# Patient Record
Sex: Male | Born: 1950 | Race: White | Hispanic: No | Marital: Married | Smoking: Never smoker
Health system: Southern US, Community
[De-identification: ages and names within clinical notes are randomized; demographics above are authoritative.]

## PROBLEM LIST (undated history)

## (undated) DIAGNOSIS — K219 Gastro-esophageal reflux disease without esophagitis: Secondary | ICD-10-CM

## (undated) DIAGNOSIS — E119 Type 2 diabetes mellitus without complications: Secondary | ICD-10-CM

## (undated) DIAGNOSIS — I10 Essential (primary) hypertension: Secondary | ICD-10-CM

## (undated) DIAGNOSIS — J45909 Unspecified asthma, uncomplicated: Secondary | ICD-10-CM

## (undated) DIAGNOSIS — G2581 Restless legs syndrome: Secondary | ICD-10-CM

## (undated) DIAGNOSIS — E039 Hypothyroidism, unspecified: Secondary | ICD-10-CM

## (undated) DIAGNOSIS — N200 Calculus of kidney: Secondary | ICD-10-CM

## (undated) DIAGNOSIS — M109 Gout, unspecified: Secondary | ICD-10-CM

## (undated) DIAGNOSIS — E7800 Pure hypercholesterolemia, unspecified: Secondary | ICD-10-CM

## (undated) HISTORY — DX: Pure hypercholesterolemia, unspecified: E78.00

## (undated) HISTORY — DX: Hypothyroidism, unspecified: E03.9

## (undated) HISTORY — DX: Type 2 diabetes mellitus without complications: E11.9

## (undated) HISTORY — DX: Calculus of kidney: N20.0

## (undated) HISTORY — DX: Essential (primary) hypertension: I10

## (undated) HISTORY — DX: Gout, unspecified: M10.9

## (undated) HISTORY — DX: Restless legs syndrome: G25.81

## (undated) HISTORY — PX: OTHER SURGICAL HISTORY: SHX169

---

## 2006-01-03 ENCOUNTER — Ambulatory Visit: Payer: Self-pay | Admitting: Family Medicine

## 2006-02-01 ENCOUNTER — Ambulatory Visit: Payer: Self-pay | Admitting: Family Medicine

## 2006-05-01 ENCOUNTER — Ambulatory Visit: Payer: Self-pay | Admitting: Family Medicine

## 2006-10-06 ENCOUNTER — Ambulatory Visit: Payer: Self-pay | Admitting: Physician Assistant

## 2007-02-08 ENCOUNTER — Inpatient Hospital Stay (HOSPITAL_COMMUNITY): Admission: EM | Admit: 2007-02-08 | Discharge: 2007-02-10 | Payer: Self-pay | Admitting: Emergency Medicine

## 2008-04-22 IMAGING — CT CT ABDOMEN W/ CM
2 of 5 series · 17 of 46 positions shown, 19 images · IV contrast (omnipaque)
Comparison: None.

CLINICAL DATA: GI bleed. Abdominal pain.
ABDOMEN CT WITH CONTRAST:
TECHNIQUE: Multidetector CT imaging of the abdomen was performed following the standard protocol during bolus administration of intravenous contrast.
Contrast:  125 cc Omnipaque 300
TECHNIQUE: Multidetector CT imaging of the pelvis was performed following the standard protocol during bolus administration of intravenous contrast.

[Series 2: abd_pel 5.0 b40s · axial · 0.80mm/px · z∈[-442,+18]mm · 14 of 104 slices shown, 16 images]
[im 6/104  soft-tissue]
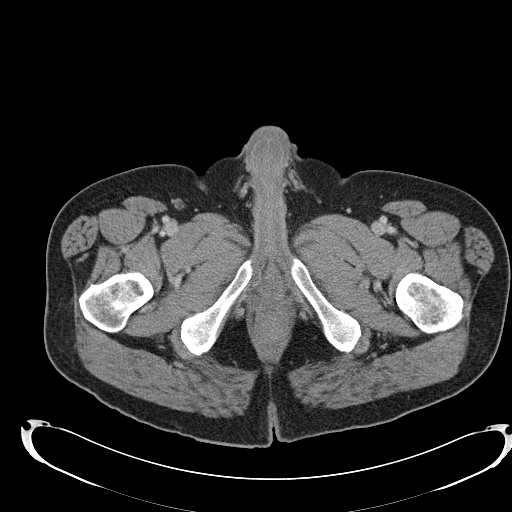
[im 6/104  bone]
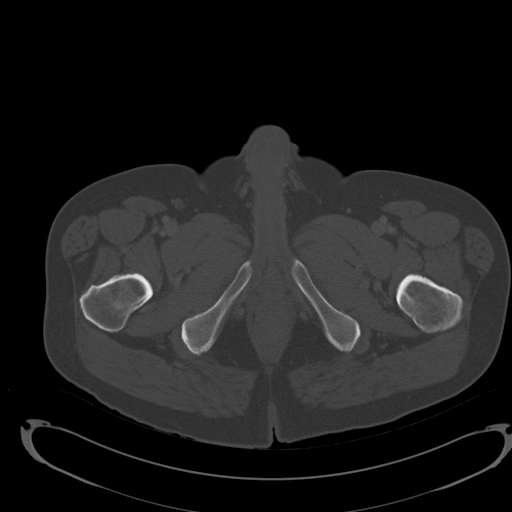
[im 16/104  soft-tissue]
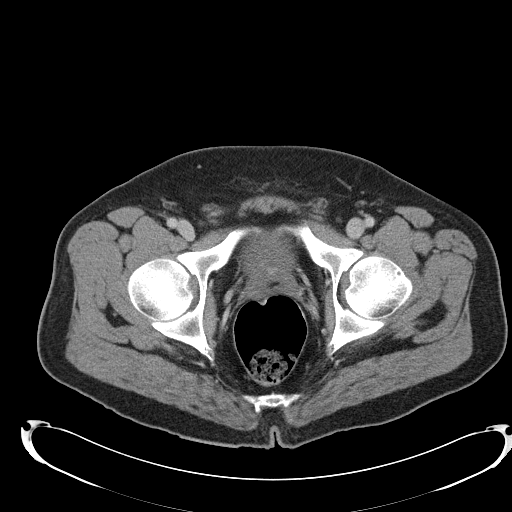
[im 21/104  soft-tissue]
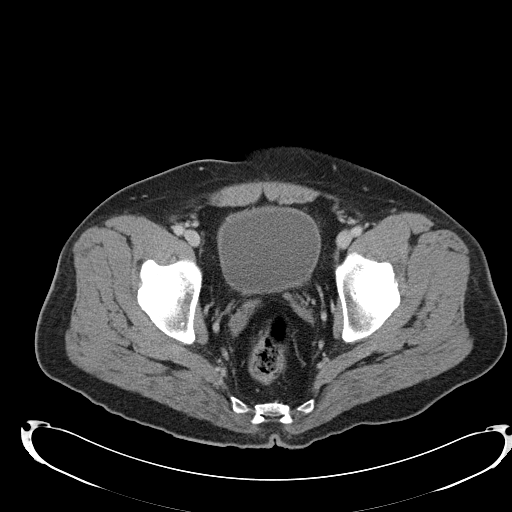
[im 26/104  soft-tissue]
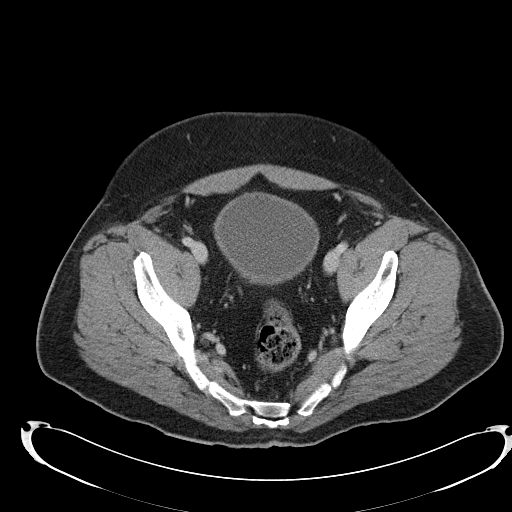
[im 37/104  soft-tissue]
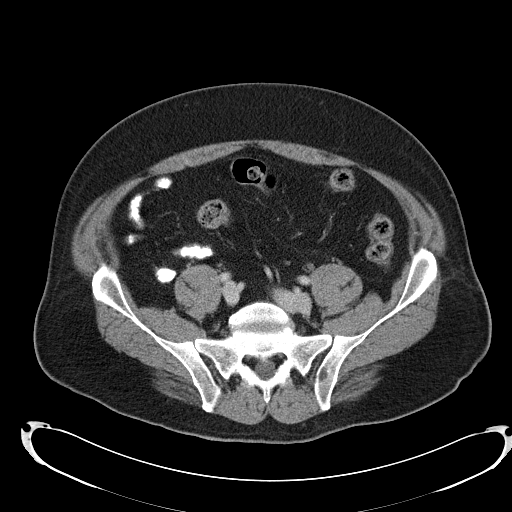
[im 42/104  soft-tissue]
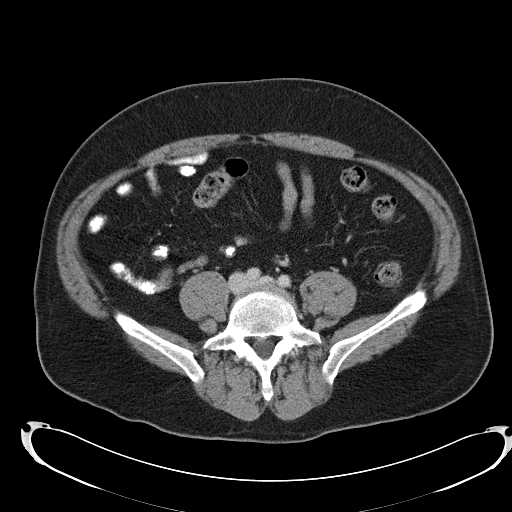
[im 47/104  soft-tissue]
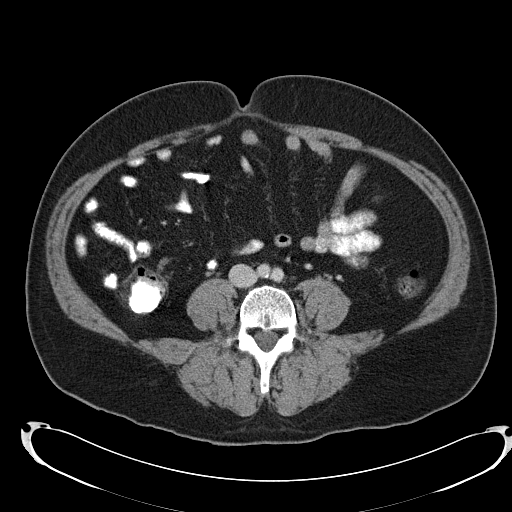
[im 57/104  soft-tissue]
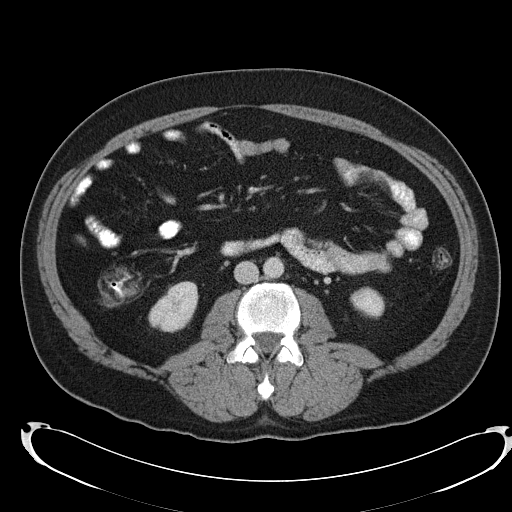
[im 62/104  soft-tissue]
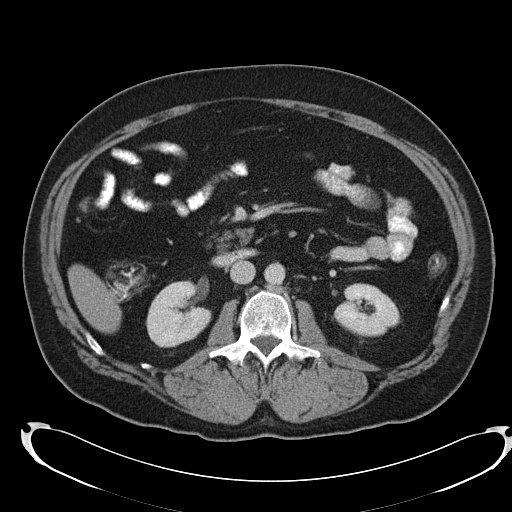
[im 62/104  bone]
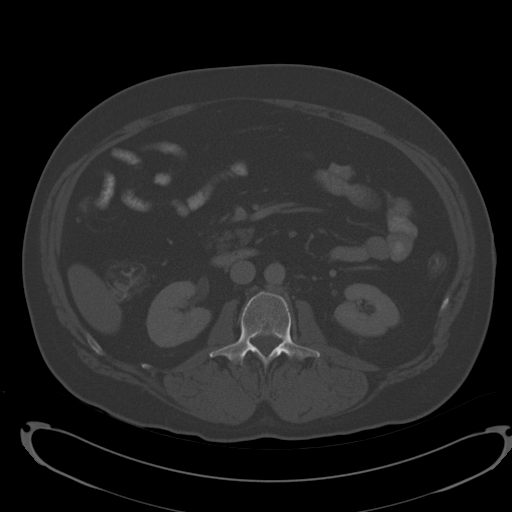
[im 67/104  soft-tissue]
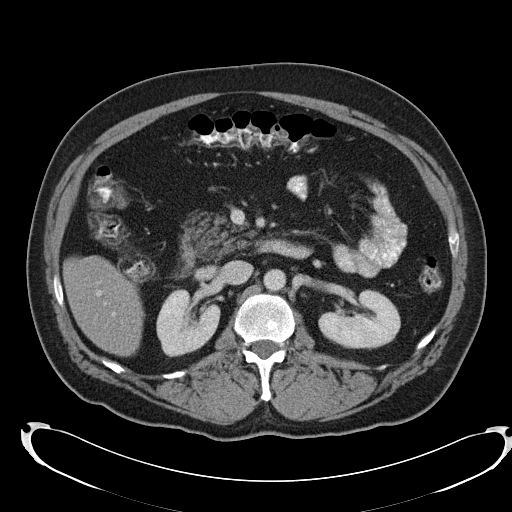
[im 78/104  soft-tissue]
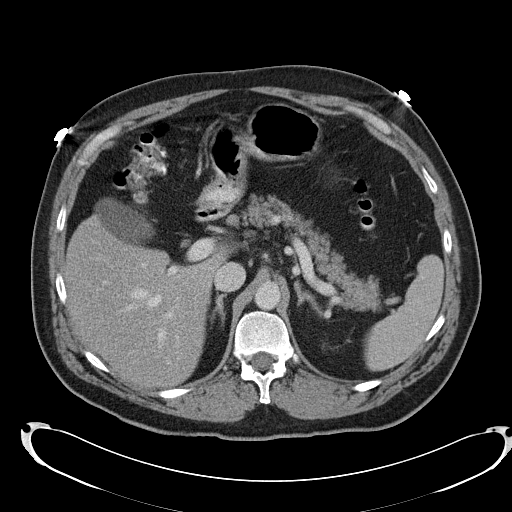
[im 83/104  soft-tissue]
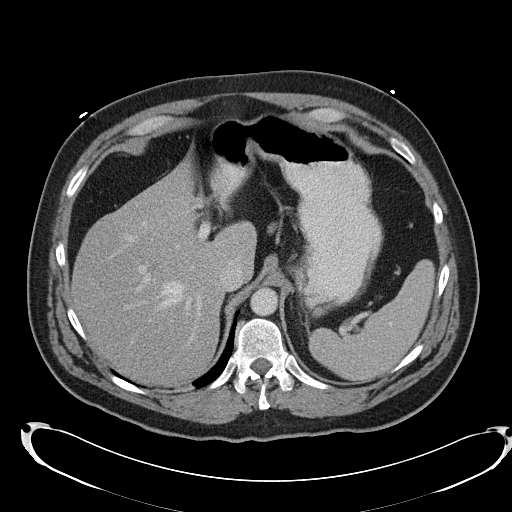
[im 88/104  soft-tissue]
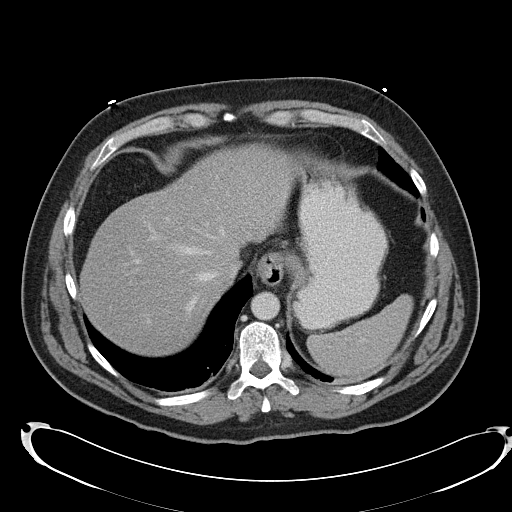
[im 98/104  soft-tissue]
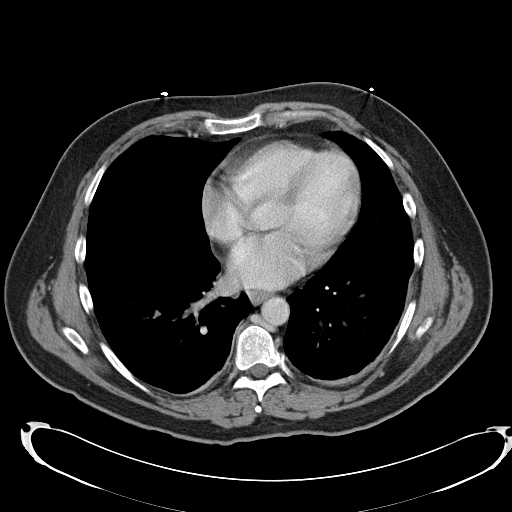

[Series 602: <mpr thick range> · coronal · 1.01mm/px · 3 of 84 slices shown]
[im 28/84  soft-tissue]
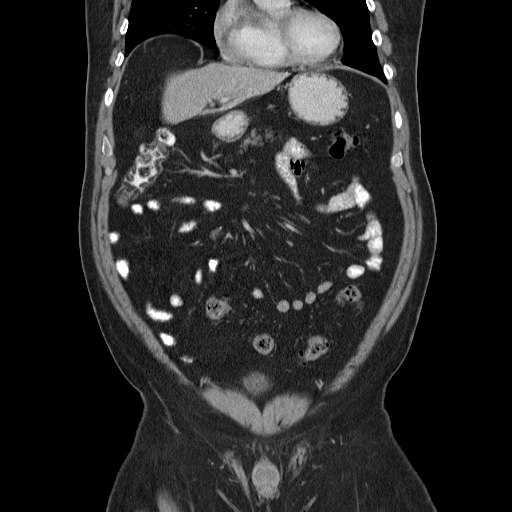
[im 37/84  soft-tissue]
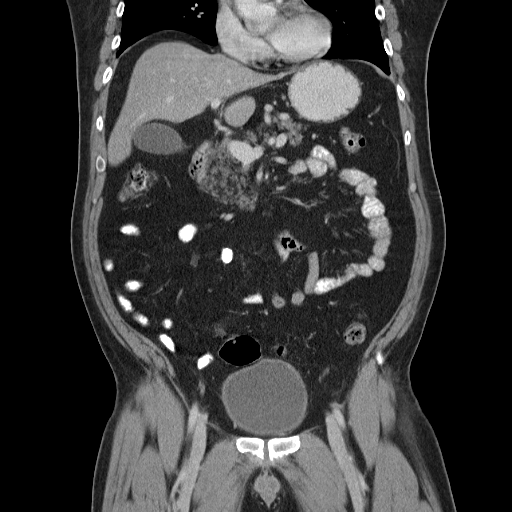
[im 47/84  soft-tissue]
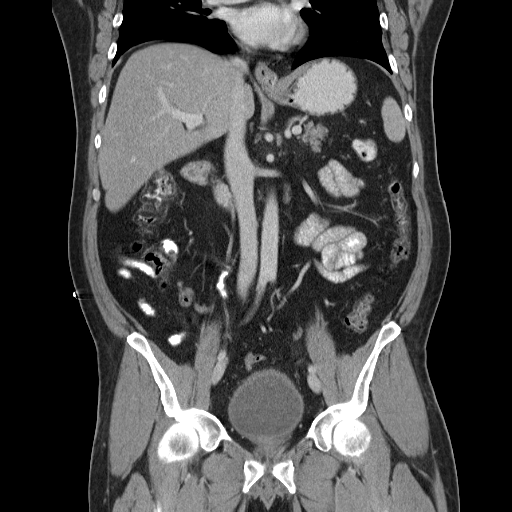

[17 of 46 positions shown; findings below may reference images not displayed]

FINDINGS: There are mild dependent type changes identified in both lung bases.
The liver is normal.  
Spleen is normal. Pancreas is normal.
Both adrenal glands are normal.
Left kidney is normal.  Within the lower pole of the right kidney, there is a stone which measures 9.4 mm (image 45).  Negative for retroperitoneal or small bowel mesenteric adenopathy.
Negative for free fluid. Negative for free intraperitoneal air.
The large bowel is abnormal in appearance. There is mild diffuse edema of the bowel wall in the setting of lower gastrointestinal bleed. Findings are concerning for colitis.  No masses are identified.
Review of the bone windows shows no lytic or sclerotic lesions.
IMPRESSION: 1.  Mild diffuse wall thickening involving the colon.  Findings may represent colitis. 
2.  No masses are identified.
PELVIS CT WITH CONTRAST:
FINDINGS: The sigmoid colon and rectum are normal.  Negative for free fluid.  Urinary bladder is negative.  
  Negative for pelvic or inguinal adenopathy.
Review of the bone windows is unremarkable.
IMPRESSION: No acute pelvic CT findings.

## 2016-08-17 DIAGNOSIS — M109 Gout, unspecified: Secondary | ICD-10-CM | POA: Insufficient documentation

## 2016-08-17 DIAGNOSIS — E039 Hypothyroidism, unspecified: Secondary | ICD-10-CM | POA: Insufficient documentation

## 2016-08-17 DIAGNOSIS — E1169 Type 2 diabetes mellitus with other specified complication: Secondary | ICD-10-CM | POA: Insufficient documentation

## 2016-10-02 ENCOUNTER — Ambulatory Visit: Payer: BLUE CROSS/BLUE SHIELD | Attending: Sports Medicine | Admitting: Physical Therapy

## 2016-10-02 ENCOUNTER — Encounter: Payer: Self-pay | Admitting: Physical Therapy

## 2016-10-02 DIAGNOSIS — M25611 Stiffness of right shoulder, not elsewhere classified: Secondary | ICD-10-CM | POA: Diagnosis present

## 2016-10-02 DIAGNOSIS — M25511 Pain in right shoulder: Secondary | ICD-10-CM | POA: Diagnosis not present

## 2016-10-02 NOTE — Therapy (Signed)
[facility] [address] [place], [place], [address] Phone: [phone]   Fax:  [phone]  Physical Therapy Evaluation  Patient Details  Name: Warren DenisJames H Johnstone MRN: [id number] Date of Birth: 1951/08/07 Referring Provider: Rodolph BongAdam Kendall MD.  Encounter Date: 10/02/2016      PT End of Session - 10/02/16 1554    Visit Number 1   Number of Visits 12   Date for PT Re-Evaluation 11/13/16   PT Start Time 0315   PT Stop Time 0352   PT Time Calculation (min) 37 min   Activity Tolerance Patient tolerated treatment well   Behavior During Therapy [facility] for tasks assessed/performed      Past Medical History:  Diagnosis Date  . Diabetes mellitus without complication (HCC)   . Gout   . Hypertension     No past surgical history on file.  There were no vitals filed for this visit.       Subjective Assessment - 10/02/16 1558    Subjective The patient reports that the first of last summer he began to experience right shoulder discomfort.  As time went on his shoulder began to hurt more and he lost range of motion.  He eventually saw a Dr. and receibved a shot which did not help much.  However, he received anothet injection last wekk "in the joint" and it helped a lot.  In fact due to the injection his pain-level is reduced to a resting level of a 2/10 today and his motion did improve as well.  Ice also helps reduce his pain.            [facility] - 10/02/16 0001      Assessment   Medical Diagnosis Adhesive capsulitis.   Referring Provider Rodolph BongAdam Kendall MD.   Onset Date/Surgical Date --  First of last summer   Next MD Visit --  11/08/16.     Precautions   Precautions None     Restrictions   Weight Bearing Restrictions No     Balance Screen   Has the patient fallen in the past 6 months No   Has the patient had a decrease in activity level because of a fear of falling?  No   Is the patient reluctant to leave their home  because of a fear of falling?  No     Home Environment   Living Environment Private residence     Prior Function   Level of Independence Independent     Posture/Postural Control   Posture/Postural Control Postural limitations   Postural Limitations Rounded Shoulders;Forward head;Decreased thoracic kyphosis     ROM / Strength   AROM / PROM / Strength AROM;Strength     AROM   Overall AROM Comments Active right shoulder flexion in the antigravity position= 72 degrees; ER= 18 degrees and behind back to right SIJ region.     Strength   Overall Strength Comments Right shoulder tested into abduction, IR/ER= 4- to 4/5.     Palpation   Palpation comment Soem right ACJ pain but his CC is pain referred into right middle deltoid region.     Special Tests    Special Tests Rotator Cuff Impingement  (+) Impingement test of right shoulder. 1+/4+ DTR's RR UE.     Ambulation/Gait   Gait Comments WNL.                   [facility] Adult PT Treatment/Exercise - 10/02/16 0001  Exercises   Exercises Shoulder     Shoulder Exercises: Supine   Other Supine Exercises Supine cane exercise to passively increase right shoulder ER and into flexion.     Shoulder Exercises: Pulleys   Flexion Limitations --  5 minutes.     Shoulder Exercises: ROM/Strengthening   UBE (Upper Arm Bike) 6 minutes at 120 RPM's.                PT Education - 10/02/16 1616    Education provided Yes   Person(s) Educated Patient   Methods Explanation;Demonstration;Tactile cues;Verbal cues   Comprehension Verbalized understanding;Returned demonstration             PT Long Term Goals - 10/02/16 1618      PT LONG TERM GOAL #1   Title Independent with a HEP.   Time 4   Period Weeks   Status New     PT LONG TERM GOAL #2   Title Active right shoulder flexion to 145 degrees so the patient can easily reach overhead   Time 4   Period Weeks   Status New     PT LONG TERM GOAL #3   Title  Active ER to 70 degrees+ to allow for easily donning/doffing of apparel   Time 4   Period Weeks   Status New     PT LONG TERM GOAL #4   Title Increase ROM so patient is able to reach behind back to L3.   Time 4   Period Weeks   Status New     PT LONG TERM GOAL #5   Title Patient perform ADL's with pain not > 3/10.   Time 4   Period Weeks   Status New               Plan - 10/02/16 1608    Rehab Potential Good   PT Frequency 3x / week   PT Duration 4 weeks   PT Treatment/Interventions ADLs/Self Care Home Management;Cryotherapy;Electrical Stimulation;Ultrasound;Moist Heat;Therapeutic activities;Therapeutic exercise;Patient/family education;Passive range of motion;Manual techniques   PT Next Visit Plan PROM and capsular stretching of right shoulder.  Please give pics of home exercises.  UE Ranger; cane exercises; wall ladder.  Modalites PRN.      Patient will benefit from skilled therapeutic intervention in order to improve the following deficits and impairments:  Decreased activity tolerance, Pain, Decreased strength, Decreased range of motion  Visit Diagnosis: Acute pain of right shoulder - Plan: PT plan of care cert/re-cert  Stiffness of right shoulder, not elsewhere classified - Plan: PT plan of care cert/re-cert     Problem List There are no active problems to display for this patient.   Lovina Zuver, [place] MPT 10/02/2016, 4:21 PM  [facility] [address] [place], [place], [address] Phone: [phone]   Fax:  [phone]  Name: HASKEL DEWALT MRN: [id number] Date of Birth: 1951-06-22

## 2016-10-02 NOTE — Patient Instructions (Signed)
Supine cane exercise into right shoulder ER and flexion.

## 2016-10-03 ENCOUNTER — Ambulatory Visit: Payer: BLUE CROSS/BLUE SHIELD | Admitting: Physical Therapy

## 2016-10-03 DIAGNOSIS — M25511 Pain in right shoulder: Secondary | ICD-10-CM | POA: Diagnosis not present

## 2016-10-03 DIAGNOSIS — M25611 Stiffness of right shoulder, not elsewhere classified: Secondary | ICD-10-CM

## 2016-10-03 NOTE — Therapy (Signed)
[facility] [address] [place], [place], [address] Phone: [phone]   Fax:  [phone]  Physical Therapy Treatment  Patient Details  Name: Warren Newton MRN: [id number] Date of Birth: 03/25/1951 Referring Provider: Rodolph BongAdam Kendall MD.  Encounter Date: 10/03/2016      PT End of Session - 10/03/16 1707    Visit Number 2   Number of Visits 12   Date for PT Re-Evaluation 11/13/16   PT Start Time 0400   PT Stop Time 0455   PT Time Calculation (min) 55 min   Activity Tolerance Patient tolerated treatment well   Behavior During Therapy [facility] for tasks assessed/performed      Past Medical History:  Diagnosis Date  . Diabetes mellitus without complication (HCC)   . Gout   . Hypertension     No past surgical history on file.  There were no vitals filed for this visit.      Subjective Assessment - 10/03/16 1710    Subjective I made me some pulleys and put them over a door at home.   Patient Stated Goals Move my right shoulder without pain.   Pain Score 2    Pain Location Shoulder   Pain Orientation Right   Pain Descriptors / Indicators Aching   Pain Type Acute pain   Pain Onset More than a month ago                         [facility] PT Treatment/Exercise - 10/03/16 0001      Shoulder Exercises: Standing   Other Standing Exercises Doorway stretch to increase flexion and ER (4 minutes).     Shoulder Exercises: Pulleys   Flexion Limitations 5 minutes.   Other Pulley Exercises UE Ranger x 4 minutes     Shoulder Exercises: ROM/Strengthening   UBE (Upper Arm Bike) 8 minutes.     Modalities   Modalities Ultrasound     Ultrasound   Ultrasound Location In supine right shoulder   Ultrasound Parameters 1.50 W/CM2 x 10 minutes while patient received a sustained low load long duration stretch into ER     Manual Therapy   Manual therapy comments 4 minutes into right shoulder flexion passively.                 PT Education - 10/02/16 1616    Education provided Yes   Person(s) Educated Patient   Methods Explanation;Demonstration;Tactile cues;Verbal cues   Comprehension Verbalized understanding;Returned demonstration             PT Long Term Goals - 10/02/16 1618      PT LONG TERM GOAL #1   Title Independent with a HEP.   Time 4   Period Weeks   Status New     PT LONG TERM GOAL #2   Title Active right shoulder flexion to 145 degrees so the patient can easily reach overhead   Time 4   Period Weeks   Status New     PT LONG TERM GOAL #3   Title Active ER to 70 degrees+ to allow for easily donning/doffing of apparel   Time 4   Period Weeks   Status New     PT LONG TERM GOAL #4   Title Increase ROM so patient is able to reach behind back to L3.   Time 4   Period Weeks   Status New     PT LONG TERM GOAL #5  Title Patient perform ADL's with pain not > 3/10.   Time 4   Period Weeks   Status New               Plan - 10/02/16 1608    Rehab Potential Good   PT Frequency 3x / week   PT Duration 4 weeks   PT Treatment/Interventions ADLs/Self Care Home Management;Cryotherapy;Electrical Stimulation;Ultrasound;Moist Heat;Therapeutic activities;Therapeutic exercise;Patient/family education;Passive range of motion;Manual techniques   PT Next Visit Plan PROM and capsular stretching of right shoulder.  Please give pics of home exercises.  UE Ranger; cane exercises; wall ladder.  Modalites PRN.      Patient will benefit from skilled therapeutic intervention in order to improve the following deficits and impairments:  Decreased activity tolerance, Pain, Decreased strength, Decreased range of motion  Visit Diagnosis: Acute pain of right shoulder  Stiffness of right shoulder, not elsewhere classified     Problem List There are no active problems to display for this patient.   Josehua Hammar, [place] MPT 10/03/2016, 5:22 PM  [facility] [address] [place], [place], [address] Phone: [phone]   Fax:  [phone]  Name: Warren Newton MRN: [id number] Date of Birth: 09-13-1951

## 2016-10-10 ENCOUNTER — Ambulatory Visit: Payer: BLUE CROSS/BLUE SHIELD | Admitting: Physical Therapy

## 2016-10-10 DIAGNOSIS — M25511 Pain in right shoulder: Secondary | ICD-10-CM

## 2016-10-10 DIAGNOSIS — M25611 Stiffness of right shoulder, not elsewhere classified: Secondary | ICD-10-CM

## 2016-10-10 NOTE — Therapy (Signed)
[facility] [address] [place], [place], [address] Phone: [phone]   Fax:  [phone]  Physical Therapy Treatment  Patient Details  Name: Warren DenisJames H Cullin MRN: [id number] Date of Birth: 09/26/1951 Referring Provider: Rodolph BongAdam Kendall MD.  Encounter Date: 10/10/2016      PT End of Session - 10/10/16 1523    Visit Number 3   Number of Visits 12   Date for PT Re-Evaluation 11/13/16   PT Start Time 1521   PT Stop Time 1619   PT Time Calculation (min) 58 min   Activity Tolerance Patient tolerated treatment well   Behavior During Therapy [facility] for tasks assessed/performed      Past Medical History:  Diagnosis Date  . Diabetes mellitus without complication (HCC)   . Gout   . Hypertension     No past surgical history on file.  There were no vitals filed for this visit.      Subjective Assessment - 10/10/16 1522    Subjective Reports that he has seen some improvement in regards to ROM as he noticed he can move shoulder in ways he wasn't able to before.   Pertinent History DM.   Patient Stated Goals Move my right shoulder without pain.   Currently in Pain? Other (Comment)  No reports of pain by patient            [facility] - 10/10/16 0001      Assessment   Medical Diagnosis Adhesive capsulitis.   Next MD Visit 11/08/2016     Precautions   Precautions None     Restrictions   Weight Bearing Restrictions No                     [facility] Adult PT Treatment/Exercise - 10/10/16 0001      Shoulder Exercises: Pulleys   Flexion Other (comment)  x5 min   Other Pulley Exercises UE ranger into flexion/ CW and CCW circles x20 reps each     Shoulder Exercises: ROM/Strengthening   UBE (Upper Arm Bike) 120 RPM x6 min     Shoulder Exercises: Stretch   External Rotation Stretch 5 reps;10 seconds   Other Shoulder Stretches RUE towel stretch x5 reps 10 sec hold     Modalities   Modalities Electrical  Stimulation;Moist Heat     Moist Heat Therapy   Number Minutes Moist Heat 15 Minutes   Moist Heat Location Shoulder     Electrical Stimulation   Electrical Stimulation Location R shoulder   Electrical Stimulation Action Pre-Mod   Electrical Stimulation Parameters 80-150 hz x15 min   Electrical Stimulation Goals Pain     Manual Therapy   Manual Therapy Joint mobilization;Passive ROM   Joint Mobilization G I-II R glenohumeral joint mobilizations into A/P/I to promote decreaesed pain and increase ROM   Passive ROM PROM of R shoulder into flex/ER with holds at end range to promote increased ROM                     PT Long Term Goals - 10/02/16 1618      PT LONG TERM GOAL #1   Title Independent with a HEP.   Time 4   Period Weeks   Status New     PT LONG TERM GOAL #2   Title Active right shoulder flexion to 145 degrees so the patient can easily reach overhead   Time 4   Period Weeks   Status New  PT LONG TERM GOAL #3   Title Active ER to 70 degrees+ to allow for easily donning/doffing of apparel   Time 4   Period Weeks   Status New     PT LONG TERM GOAL #4   Title Increase ROM so patient is able to reach behind back to L3.   Time 4   Period Weeks   Status New     PT LONG TERM GOAL #5   Title Patient perform ADL's with pain not > 3/10.   Time 4   Period Weeks   Status New               Plan - 10/10/16 1611    Clinical Impression Statement Patient presented in clinic with reported improvement seen in his daily activities. Patient reports compliance with HEP at this time. Patient able to complete all exercises directed with minimal multimodal cueing for proper exercise technique. R shoulder capsule tightness noted with R shoulder ER and towel stretch today. Tolerated R glenohumeral grade I-II joint mobilizations well with only complaint of discomfort with anterior direction. R shoulder joint capsule tightness present in both anterior and posterior  directions with glenohumeral mobilizations. Firm end feels noted with PROM of R shoulder into flexion and ER with holds at end range to promote ROM. Normal modalities response noted following removal of the modalities. Patient encouraged to continue HEP at home to continue improvements.   Rehab Potential Good   PT Frequency 3x / week   PT Duration 4 weeks   PT Treatment/Interventions ADLs/Self Care Home Management;Cryotherapy;Electrical Stimulation;Ultrasound;Moist Heat;Therapeutic activities;Therapeutic exercise;Patient/family education;Passive range of motion;Manual techniques   PT Next Visit Plan Continue with R shoulder stretching and ROM exercises with modalities PRN per MPT POC.   Consulted and Agree with Plan of Care Patient      Patient will benefit from skilled therapeutic intervention in order to improve the following deficits and impairments:  Decreased activity tolerance, Pain, Decreased strength, Decreased range of motion  Visit Diagnosis: Acute pain of right shoulder  Stiffness of right shoulder, not elsewhere classified     Problem List There are no active problems to display for this patient.   Evelene CroonKelsey M Parsons, PTA 10/10/2016, 4:35 PM  [facility] [address] [place], [place], [address] Phone: [phone]   Fax:  [phone]  Name: Warren DenisJames H Sanon MRN: [id number] Date of Birth: 10-Oct-1951

## 2016-10-12 ENCOUNTER — Ambulatory Visit: Payer: BLUE CROSS/BLUE SHIELD | Attending: Sports Medicine | Admitting: Physical Therapy

## 2016-10-12 DIAGNOSIS — M25511 Pain in right shoulder: Secondary | ICD-10-CM | POA: Diagnosis not present

## 2016-10-12 DIAGNOSIS — M25611 Stiffness of right shoulder, not elsewhere classified: Secondary | ICD-10-CM | POA: Diagnosis present

## 2016-10-12 NOTE — Therapy (Signed)
[facility] [address] [place], [place], [address] Phone: [phone]   Fax:  [phone]  Physical Therapy Treatment  Patient Details  Name: Warren Newton MRN: [id number] Date of Birth: 1951-10-09 Referring Provider: Rodolph BongAdam Kendall MD.  Encounter Date: 10/12/2016      PT End of Session - 10/12/16 1520    Visit Number 4   Number of Visits 12   Date for PT Re-Evaluation 11/13/16   PT Start Time 1518   PT Stop Time 1605   PT Time Calculation (min) 47 min   Activity Tolerance Patient tolerated treatment well   Behavior During Therapy [facility] for tasks assessed/performed      Past Medical History:  Diagnosis Date  . Diabetes mellitus without complication (HCC)   . Gout   . Hypertension     No past surgical history on file.  There were no vitals filed for this visit.      Subjective Assessment - 10/12/16 1519    Subjective Reports that he thinks his shoulder is better. Reports that he does not have pain constantly only with movement of RUE.   Pertinent History DM.   Patient Stated Goals Move my right shoulder without pain.   Currently in Pain? Yes   Pain Score 7    Pain Location Shoulder   Pain Orientation Right   Pain Type Acute pain   Pain Onset More than a month ago   Pain Frequency Intermittent   Aggravating Factors  Movement            [facility] PT Assessment - 10/12/16 0001      Assessment   Medical Diagnosis Adhesive capsulitis.   Next MD Visit 11/08/2016     Precautions   Precautions None     Restrictions   Weight Bearing Restrictions No                     [facility] Adult PT Treatment/Exercise - 10/12/16 0001      Shoulder Exercises: Standing   Other Standing Exercises RUE wall slides x5 sec hold x20 reps     Shoulder Exercises: Pulleys   Flexion Other (comment)  x5 min   Other Pulley Exercises UE ranger into flexion/ CW and CCW circles x20 reps each     Shoulder Exercises:  ROM/Strengthening   UBE (Upper Arm Bike) 120 RPM x6 min     Shoulder Exercises: Stretch   External Rotation Stretch 5 reps;10 seconds   Other Shoulder Stretches RUE towel stretch x5 reps 10 sec hold     Modalities   Modalities Electrical Stimulation;Moist Heat;Ultrasound     Moist Heat Therapy   Number Minutes Moist Heat 15 Minutes   Moist Heat Location Shoulder     Electrical Stimulation   Electrical Stimulation Location R shoulder   Electrical Stimulation Action Pre-Mod   Electrical Stimulation Parameters 80-150 hz x15 min   Electrical Stimulation Goals Pain     Ultrasound   Ultrasound Location R anterior shoulder  with LLDS into ER   Ultrasound Parameters 1.5 w/cm2, 100%, 1 mhz x10 min   Ultrasound Goals Pain                     PT Long Term Goals - 10/02/16 1618      PT LONG TERM GOAL #1   Title Independent with a HEP.   Time 4   Period Weeks   Status New     PT LONG TERM  GOAL #2   Title Active right shoulder flexion to 145 degrees so the patient can easily reach overhead   Time 4   Period Weeks   Status New     PT LONG TERM GOAL #3   Title Active ER to 70 degrees+ to allow for easily donning/doffing of apparel   Time 4   Period Weeks   Status New     PT LONG TERM GOAL #4   Title Increase ROM so patient is able to reach behind back to L3.   Time 4   Period Weeks   Status New     PT LONG TERM GOAL #5   Title Patient perform ADL's with pain not > 3/10.   Time 4   Period Weeks   Status New               Plan - 10/12/16 1525    Clinical Impression Statement Patient presented in clinic with reports of overall improvement with R shoulder ROM. Patient continues to report compliance with HEP at home. Patient able to complete all exercises as directed with only minimal multimodal cueing for proper exercise technique. Continued R joint capsule tightness observed today with exercises. LLDS into ER completed with R anterior shoulder US to  stretch joint capsule. Normal modalities response noted following removal of the modalities.   Rehab Potential Good   PT Frequency 3x / week   PT Duration 4 weeks   PT Treatment/Interventions ADLs/Self Care Home Management;Cryotherapy;Electrical Stimulation;Ultrasound;Moist Heat;Therapeutic activities;Therapeutic exercise;Patient/family education;Passive range of motion;Manual techniques   PT Next Visit Plan Continue with R shoulder stretching and ROM exercises with modalities PRN per MPT POC.   Consulted and Agree with Plan of Care Patient      Patient will benefit from skilled therapeutic intervention in order to improve the following deficits and impairments:  Decreased activity tolerance, Pain, Decreased strength, Decreased range of motion  Visit Diagnosis: Acute pain of right shoulder  Stiffness of right shoulder, not elsewhere classified     Problem List There are no active problems to display for this patient.   Evelene CroonKelsey M Parsons, PTA 10/12/2016, 4:52 PM  [facility] [address] [place], [place], [address] Phone: [phone]   Fax:  [phone]  Name: Warren Newton MRN: [id number] Date of Birth: 12/06/1951

## 2016-10-17 ENCOUNTER — Ambulatory Visit: Payer: BLUE CROSS/BLUE SHIELD | Admitting: Physical Therapy

## 2016-10-17 DIAGNOSIS — M25511 Pain in right shoulder: Secondary | ICD-10-CM

## 2016-10-17 DIAGNOSIS — M25611 Stiffness of right shoulder, not elsewhere classified: Secondary | ICD-10-CM

## 2016-10-17 NOTE — Therapy (Signed)
[facility] [address] [place], [place], [address] Phone: [phone]   Fax:  [phone]  Physical Therapy Treatment  Patient Details  Name: Warren DenisJames H Mullenax MRN: [id number] Date of Birth: 1951/04/25 Referring Provider: Rodolph BongAdam Kendall MD.  Encounter Date: 10/17/2016      PT End of Session - 10/17/16 1519    Visit Number 5   Number of Visits 12   Date for PT Re-Evaluation 11/13/16   PT Start Time 1519   PT Stop Time 1603   PT Time Calculation (min) 44 min   Activity Tolerance Patient tolerated treatment well   Behavior During Therapy [facility] for tasks assessed/performed      Past Medical History:  Diagnosis Date  . Diabetes mellitus without complication (HCC)   . Gout   . Hypertension     No past surgical history on file.  There were no vitals filed for this visit.      Subjective Assessment - 10/17/16 1519    Subjective Reports that he thinks his shoulder is getting better with flexion. Reports that US with stretch helped him although it hurt but can now get his wallet out of his backpocket. Reports that he will be out of town next week but states he will do his exercises at night as he does them now.   Pertinent History DM.   Patient Stated Goals Move my right shoulder without pain.   Currently in Pain? Yes   Pain Score 5    Pain Location Shoulder   Pain Orientation Right   Pain Descriptors / Indicators Discomfort   Pain Type Acute pain   Pain Onset More than a month ago   Pain Frequency Intermittent   Aggravating Factors  At end range movement            [facility] - 10/17/16 0001      Assessment   Medical Diagnosis Adhesive capsulitis.   Next MD Visit 11/08/2016     Precautions   Precautions None     Restrictions   Weight Bearing Restrictions No                     [facility] Adult PT Treatment/Exercise - 10/17/16 0001      Shoulder Exercises: Pulleys   Flexion Other (comment)   Other  Pulley Exercises UE ranger into flexion x20 reps     Shoulder Exercises: ROM/Strengthening   UBE (Upper Arm Bike) 90 RPM x6 min     Shoulder Exercises: Stretch   External Rotation Stretch 5 reps;10 seconds   Other Shoulder Stretches RUE towel stretch x5 reps 10 sec hold     Modalities   Modalities Electrical Stimulation;Moist Heat;Ultrasound     Moist Heat Therapy   Number Minutes Moist Heat 15 Minutes   Moist Heat Location Shoulder     Electrical Stimulation   Electrical Stimulation Location R shoulder   Electrical Stimulation Action Pre-Mod   Electrical Stimulation Parameters 80-150 hz x15 min   Electrical Stimulation Goals Pain     Ultrasound   Ultrasound Location R anterior shoulder with LLDS into ER   Ultrasound Parameters 1.5 w/cm2, 100%,1 mhz x10 min   Ultrasound Goals Pain                     PT Long Term Goals - 10/02/16 1618      PT LONG TERM GOAL #1   Title Independent with a HEP.   Time 4  Period Weeks   Status New     PT LONG TERM GOAL #2   Title Active right shoulder flexion to 145 degrees so the patient can easily reach overhead   Time 4   Period Weeks   Status New     PT LONG TERM GOAL #3   Title Active ER to 70 degrees+ to allow for easily donning/doffing of apparel   Time 4   Period Weeks   Status New     PT LONG TERM GOAL #4   Title Increase ROM so patient is able to reach behind back to L3.   Time 4   Period Weeks   Status New     PT LONG TERM GOAL #5   Title Patient perform ADL's with pain not > 3/10.   Time 4   Period Weeks   Status New               Plan - 10/17/16 1555    Clinical Impression Statement Patient presented in clinic with reports of improvement with R shoulder ROM. Patient able to complete all exercises well with discomfort with stretches but no pain reports per patient report. US to R anterior shoulder with LLDS into ER was completed again to decrease joint capsule tightness and improve ROM with  only complaint of discomfort following coming out of LLDS. Normal modalities response noted following removal of the modalites.   Rehab Potential Good   PT Frequency 3x / week   PT Duration 4 weeks   PT Treatment/Interventions ADLs/Self Care Home Management;Cryotherapy;Electrical Stimulation;Ultrasound;Moist Heat;Therapeutic activities;Therapeutic exercise;Patient/family education;Passive range of motion;Manual techniques   PT Next Visit Plan Continue with R shoulder stretching and ROM exercises with modalities PRN per MPT POC.   Consulted and Agree with Plan of Care Patient      Patient will benefit from skilled therapeutic intervention in order to improve the following deficits and impairments:  Decreased activity tolerance, Pain, Decreased strength, Decreased range of motion  Visit Diagnosis: Acute pain of right shoulder  Stiffness of right shoulder, not elsewhere classified     Problem List There are no active problems to display for this patient.   Evelene CroonKelsey M Parsons, PTA 10/17/2016, 4:25 PM  [facility] [address] [place], [place], [address] Phone: [phone]   Fax:  [phone]  Name: Warren DenisJames H Fotheringham MRN: [id number] Date of Birth: 07/23/1951

## 2016-10-19 ENCOUNTER — Ambulatory Visit: Payer: BLUE CROSS/BLUE SHIELD | Admitting: *Deleted

## 2016-10-19 DIAGNOSIS — M25511 Pain in right shoulder: Secondary | ICD-10-CM | POA: Diagnosis not present

## 2016-10-19 DIAGNOSIS — M25611 Stiffness of right shoulder, not elsewhere classified: Secondary | ICD-10-CM

## 2016-10-19 NOTE — Therapy (Signed)
[facility] [address] [place], [place], [address] Phone: [phone]   Fax:  [phone]  Physical Therapy Treatment  Patient Details  Name: Warren Newton MRN: [id number] Date of Birth: 11-18-1951 Referring Provider: Rodolph BongAdam Kendall MD.  Encounter Date: 10/19/2016      PT End of Session - 10/19/16 1533    Visit Number 6   Number of Visits 12   Date for PT Re-Evaluation 11/13/16   PT Start Time 1515   PT Stop Time 1614   PT Time Calculation (min) 59 min      Past Medical History:  Diagnosis Date  . Diabetes mellitus without complication (HCC)   . Gout   . Hypertension     No past surgical history on file.  There were no vitals filed for this visit.      Subjective Assessment - 10/19/16 1531    Subjective Reports that he thinks his shoulder is getting better with flexion. Reports that US with stretch helped him although it hurt but can now get his wallet out of his backpocket. Reports that he will be out of town next week but states he will do his exercises at night as he does them now.   Pertinent History DM.   Patient Stated Goals Move my right shoulder without pain.   Currently in Pain? Yes   Pain Score 4    Pain Location Shoulder   Pain Orientation Right   Pain Descriptors / Indicators Discomfort   Pain Onset More than a month ago   Pain Frequency Intermittent                         [facility] Adult PT Treatment/Exercise - 10/19/16 0001      Shoulder Exercises: Pulleys   Flexion Other (comment)   Other Pulley Exercises UE ranger into flexion x20 reps     Shoulder Exercises: ROM/Strengthening   UBE (Upper Arm Bike) 90 RPM x6 min     Modalities   Modalities Electrical Stimulation;Moist Heat;Ultrasound     Moist Heat Therapy   Number Minutes Moist Heat 15 Minutes   Moist Heat Location Shoulder     Electrical Stimulation   Electrical Stimulation Location R shoulder premod x 15 mins 80-150 hz   Electrical Stimulation Goals Pain     Ultrasound   Ultrasound Location RT shldr anterior aspect 1.5 w/cm2 x 10 mins   Ultrasound Goals Pain     Manual Therapy   Passive ROM PROM of R shoulder into flex/ER with holds at end range to promote increased ROM . LLLPS with ER during US                     PT Long Term Goals - 10/02/16 1618      PT LONG TERM GOAL #1   Title Independent with a HEP.   Time 4   Period Weeks   Status New     PT LONG TERM GOAL #2   Title Active right shoulder flexion to 145 degrees so the patient can easily reach overhead   Time 4   Period Weeks   Status New     PT LONG TERM GOAL #3   Title Active ER to 70 degrees+ to allow for easily donning/doffing of apparel   Time 4   Period Weeks   Status New     PT LONG TERM GOAL #4   Title Increase ROM so patient is  able to reach behind back to L3.   Time 4   Period Weeks   Status New     PT LONG TERM GOAL #5   Title Patient perform ADL's with pain not > 3/10.   Time 4   Period Weeks   Status New               Plan - 10/19/16 1827    Clinical Impression Statement Pt did farly well with Rx today and feels his RT shldr is progressing with ROM. He was able to reach PROM for flexion to 110 degrees and ER and IR to about 40 degrees. Normal modalitie response today   Rehab Potential Good   PT Frequency 3x / week   PT Duration 4 weeks   PT Treatment/Interventions ADLs/Self Care Home Management;Cryotherapy;Electrical Stimulation;Ultrasound;Moist Heat;Therapeutic activities;Therapeutic exercise;Patient/family education;Passive range of motion;Manual techniques   PT Next Visit Plan Continue with R shoulder stretching and ROM exercises with modalities PRN per MPT POC.   Consulted and Agree with Plan of Care Patient      Patient will benefit from skilled therapeutic intervention in order to improve the following deficits and impairments:  Decreased activity tolerance, Pain, Decreased  strength, Decreased range of motion  Visit Diagnosis: Acute pain of right shoulder  Stiffness of right shoulder, not elsewhere classified     Problem List There are no active problems to display for this patient.   Robi Mitter,CHRIS , PTA 10/19/2016, 6:36 PM  [facility] [address] [place], [place], [address] Phone: [phone]   Fax:  [phone]  Name: Warren Newton MRN: [id number] Date of Birth: [birth year]

## 2016-10-30 ENCOUNTER — Ambulatory Visit: Payer: BLUE CROSS/BLUE SHIELD | Admitting: Physical Therapy

## 2016-10-30 DIAGNOSIS — M25611 Stiffness of right shoulder, not elsewhere classified: Secondary | ICD-10-CM

## 2016-10-30 DIAGNOSIS — M25511 Pain in right shoulder: Secondary | ICD-10-CM | POA: Diagnosis not present

## 2016-10-30 NOTE — Therapy (Signed)
[facility] [address] [place], [place], [address] Phone: [phone]   Fax:  [phone]  Physical Therapy Treatment  Patient Details  Name: Warren Newton MRN: [id number] Date of Birth: Jun 18, 1951 Referring Provider: Rodolph BongAdam Kendall MD.  Encounter Date: 10/30/2016      PT End of Session - 10/30/16 1558    Visit Number 7   Number of Visits 12   Date for PT Re-Evaluation 11/13/16   PT Start Time 0315   Activity Tolerance Patient tolerated treatment well   Behavior During Therapy [facility] for tasks assessed/performed      Past Medical History:  Diagnosis Date  . Diabetes mellitus without complication (HCC)   . Gout   . Hypertension     No past surgical history on file.  There were no vitals filed for this visit.      Subjective Assessment - 10/30/16 1604    Subjective I went to the beach but I did my stretches.     Pain Score 4    Pain Location Shoulder   Pain Orientation Right   Pain Type Acute pain   Pain Onset More than a month ago                         [facility]/Ex[facility] - 10/30/16 0001      Exercises   Exercises Shoulder     Shoulder Exercises: ROM/Strengthening   UBE (Upper Arm Bike) 90 RPM's x 8 minutes.     Performed low load long duration stretching into right shoulder ER in the plane of the scapula x 10 minutes while receiving U/S at 1.50 W/CM2 x 10 minutes f/b IASTM x 12 minutes to patient's right anterior and posterior right shoulder region f/b Pre-mod e'stim x 15 minutes.  Excellent job today.                PT Long Term Goals - 10/02/16 1618      PT LONG TERM GOAL #1   Title Independent with a HEP.   Time 4   Period Weeks   Status New     PT LONG TERM GOAL #2   Title Active right shoulder flexion to 145 degrees so the patient can easily reach overhead   Time 4   Period Weeks   Status New     PT LONG TERM GOAL #3   Title Active ER to 70 degrees+ to  allow for easily donning/doffing of apparel   Time 4   Period Weeks   Status New     PT LONG TERM GOAL #4   Title Increase ROM so patient is able to reach behind back to L3.   Time 4   Period Weeks   Status New     PT LONG TERM GOAL #5   Title Patient perform ADL's with pain not > 3/10.   Time 4   Period Weeks   Status New             Patient will benefit from skilled therapeutic intervention in order to improve the following deficits and impairments:  Decreased activity tolerance, Pain, Decreased strength, Decreased range of motion  Visit Diagnosis: Acute pain of right shoulder  Stiffness of right shoulder, not elsewhere classified     Problem List There are no active problems to display for this patient.   Saydie Gerdts, ItalyHAD 10/30/2016, 4:40 PM  [facility] [address] [place],  [place], [address] Phone: [phone]   [place]:  [phone]  Name: Warren Newton MRN: [id number] Date of Birth: 04/11/1951

## 2016-10-31 ENCOUNTER — Ambulatory Visit: Payer: BLUE CROSS/BLUE SHIELD | Admitting: *Deleted

## 2016-10-31 DIAGNOSIS — M25511 Pain in right shoulder: Secondary | ICD-10-CM

## 2016-10-31 DIAGNOSIS — M25611 Stiffness of right shoulder, not elsewhere classified: Secondary | ICD-10-CM

## 2016-10-31 NOTE — Therapy (Signed)
[facility] [address] [place], [place], [address] Phone: [phone]   Fax:  [phone]  Physical Therapy Treatment  Patient Details  Name: Warren Newton MRN: [id number] Date of Birth: 29-Jul-1951 Referring Provider: Rodolph BongAdam Kendall MD.  Encounter Date: 10/31/2016      PT End of Session - 10/31/16 1516    Visit Number 8   Number of Visits 12   Date for PT Re-Evaluation 11/13/16   PT Start Time 1515   PT Stop Time 1615   PT Time Calculation (min) 60 min      Past Medical History:  Diagnosis Date  . Diabetes mellitus without complication (HCC)   . Gout   . Hypertension     No past surgical history on file.  There were no vitals filed for this visit.      Subjective Assessment - 10/31/16 1517    Subjective Did fairly well after last Rx. I was able to sleep                         [facility] Treatment/Exercise - 10/31/16 0001      Shoulder Exercises: ROM/Strengthening   UBE (Upper Arm Bike) 90 RPM's x 8 minutes.     Modalities   Modalities Electrical Stimulation;Moist Heat;Ultrasound     Moist Heat Therapy   Number Minutes Moist Heat 15 Minutes   Moist Heat Location Shoulder     Electrical Stimulation   Electrical Stimulation Location R shoulder premod x 15 mins 80-150 hz   Electrical Stimulation Goals Pain     Ultrasound   Ultrasound Location RT shldr    Ultrasound Parameters 1.5 w/cm2 x10   Ultrasound Goals Pain                     PT Long Term Goals - 10/02/16 1618      PT LONG TERM GOAL #1   Title Independent with a HEP.   Time 4   Period Weeks   Status New     PT LONG TERM GOAL #2   Title Active right shoulder flexion to 145 degrees so the patient can easily reach overhead   Time 4   Period Weeks   Status New     PT LONG TERM GOAL #3   Title Active ER to 70 degrees+ to allow for easily donning/doffing of apparel   Time 4   Period Weeks   Status New     PT LONG TERM GOAL #4   Title Increase ROM so patient is able to reach behind back to L3.   Time 4   Period Weeks   Status New     PT LONG TERM GOAL #5   Title Patient perform ADL's with pain not > 3/10.   Time 4   Period Weeks   Status New               Plan - 10/31/16 1517    Clinical Impression Statement Pt arrived to clinic today doing a little better with less pain when elevating his RT arm. He was still able to reach about 110 degrees with elevation and 40 degrees with ER   PT Frequency 3x / week   PT Duration 4 weeks   PT Treatment/Interventions ADLs/Self Care Home Management;Cryotherapy;Electrical Stimulation;Ultrasound;Moist Heat;Therapeutic activities;Therapeutic exercise;Patient/family education;Passive range of motion;Manual techniques   PT Next Visit Plan Continue with R shoulder stretching and ROM exercises with modalities PRN  per MPT POC.   Consulted and Agree with Plan of Care Patient      Patient will benefit from skilled therapeutic intervention in order to improve the following deficits and impairments:  Decreased activity tolerance, Pain, Decreased strength, Decreased range of motion  Visit Diagnosis: Acute pain of right shoulder  Stiffness of right shoulder, not elsewhere classified     Problem List There are no active problems to display for this patient.   RAMSEUR,CHRIS, PTA 10/31/2016, 6:15 PM  [facility] [address] [place], [place], [address] Phone: [phone]   Fax:  [phone]  Name: Warren Newton MRN: [id number] Date of Birth: 06-16-1951

## 2016-11-07 ENCOUNTER — Ambulatory Visit: Payer: BLUE CROSS/BLUE SHIELD | Admitting: *Deleted

## 2016-11-07 DIAGNOSIS — M25611 Stiffness of right shoulder, not elsewhere classified: Secondary | ICD-10-CM

## 2016-11-07 DIAGNOSIS — M25511 Pain in right shoulder: Secondary | ICD-10-CM

## 2016-11-07 NOTE — Therapy (Signed)
[facility] [address] [place], [place], [address] Phone: [phone]   Fax:  [phone]  Physical Therapy Treatment  Patient Details  Name: Warren Newton MRN: [id number] Date of Birth: 1950-12-14 Referring Provider: Rodolph BongAdam Kendall MD.  Encounter Date: 11/07/2016      PT End of Session - 11/07/16 1709    Visit Number 9   Number of Visits 12   Date for PT Re-Evaluation 11/13/16   PT Start Time 1715      Past Medical History:  Diagnosis Date  . Diabetes mellitus without complication (HCC)   . Gout   . Hypertension     No past surgical history on file.  There were no vitals filed for this visit.      Subjective Assessment - 11/07/16 1516    Subjective Did fairly well after last Rx. I was able to sleep again   Pertinent History DM.   Patient Stated Goals Move my right shoulder without pain.   Currently in Pain? Yes   Pain Score 4    Pain Location Shoulder   Pain Orientation Right   Pain Descriptors / Indicators Discomfort   Pain Type Acute pain   Pain Onset More than a month ago   Pain Frequency Intermittent            [facility] PT Assessment - 11/07/16 0001      ROM / Strength   AROM / PROM / Strength PROM     AROM   Overall AROM  Deficits     PROM   Overall PROM Comments PROM Flexion 125 degrees                      [facility] Adult PT Treatment/Exercise - 11/07/16 0001      Exercises   Exercises Shoulder     Shoulder Exercises: ROM/Strengthening   UBE (Upper Arm Bike) 90 RPM's x 8 minutes.     Modalities   Modalities Electrical Stimulation;Moist Heat;Ultrasound     Moist Heat Therapy   Moist Heat Location Shoulder     Electrical Stimulation   Electrical Stimulation Location R shoulder premod x 15 mins 80-150 hz   Electrical Stimulation Goals Pain     Ultrasound   Ultrasound Location RT shldr   Ultrasound Parameters 1.5 w/cm2 x 10 min   Ultrasound Goals Pain     Manual Therapy    Passive ROM PROM of R shoulder into flex/ER with holds at end range to promote increased ROM . LLLPS with ER during US                     PT Long Term Goals - 10/02/16 1618      PT LONG TERM GOAL #1   Title Independent with a HEP.   Time 4   Period Weeks   Status New     PT LONG TERM GOAL #2   Title Active right shoulder flexion to 145 degrees so the patient can easily reach overhead   Time 4   Period Weeks   Status New     PT LONG TERM GOAL #3   Title Active ER to 70 degrees+ to allow for easily donning/doffing of apparel   Time 4   Period Weeks   Status New     PT LONG TERM GOAL #4   Title Increase ROM so patient is able to reach behind back to L3.   Time 4   Period  Weeks   Status New     PT LONG TERM GOAL #5   Title Patient perform ADL's with pain not > 3/10.   Time 4   Period Weeks   Status New               Plan - 11/07/16 1714    Clinical Impression Statement Pt did better again today with Rx and was able to reach PROM flexion to 125 degrees, ER 42 degrees, and IR HBB to RT SIJ.   Rehab Potential Good   PT Frequency 3x / week   PT Duration 4 weeks   PT Treatment/Interventions ADLs/Self Care Home Management;Cryotherapy;Electrical Stimulation;Ultrasound;Moist Heat;Therapeutic activities;Therapeutic exercise;Patient/family education;Passive range of motion;Manual techniques   PT Next Visit Plan Continue with R shoulder stretching and ROM exercises with modalities PRN per MPT POC.Marland Kitchen. To MD tomorrow   Send MD note   Consulted and Agree with Plan of Care Patient      Patient will benefit from skilled therapeutic intervention in order to improve the following deficits and impairments:  Decreased activity tolerance, Pain, Decreased strength, Decreased range of motion  Visit Diagnosis: Acute pain of right shoulder  Stiffness of right shoulder, not elsewhere classified     Problem List There are no active problems to display for this  patient.   Dorella Laster,CHRIS, PTA 11/07/2016, 5:29 PM  [facility] [address] [place], [place], [address] Phone: [phone]   Fax:  [phone]  Name: Warren Newton MRN: [id number] Date of Birth: 1951/07/06

## 2016-11-13 ENCOUNTER — Ambulatory Visit: Payer: BLUE CROSS/BLUE SHIELD | Attending: Sports Medicine | Admitting: Physical Therapy

## 2016-11-13 DIAGNOSIS — M25611 Stiffness of right shoulder, not elsewhere classified: Secondary | ICD-10-CM | POA: Insufficient documentation

## 2016-11-13 DIAGNOSIS — M25511 Pain in right shoulder: Secondary | ICD-10-CM | POA: Insufficient documentation

## 2016-11-13 NOTE — Therapy (Signed)
[facility] [address] [place], [place], [address] Phone: [phone]   Fax:  [phone]  Physical Therapy Treatment  Patient Details  Name: Warren Newton MRN: [id number] Date of Birth: 1951/11/20 Referring Provider: Rodolph BongAdam Kendall MD.  Encounter Date: 11/13/2016      PT End of Session - 11/13/16 1930    Visit Number 10   Number of Visits 12   Date for PT Re-Evaluation 11/13/16   PT Start Time 0315   PT Stop Time 0407   PT Time Calculation (min) 52 min   Activity Tolerance Patient tolerated treatment well   Behavior During Therapy [facility] for tasks assessed/performed      Past Medical History:  Diagnosis Date  . Diabetes mellitus without complication (HCC)   . Gout   . Hypertension     No past surgical history on file.  There were no vitals filed for this visit.      Subjective Assessment - 11/13/16 1931    Subjective I got another injection.  If my shoulder doesn't get better my Dr. is going to him me se a Careers advisersurgeon.   Pain Score 4    Pain Location Shoulder   Pain Orientation Right   Pain Descriptors / Indicators Discomfort   Pain Type Acute pain   Pain Onset More than a month ago                         [facility]/Ex[facility] - 11/13/16 0001      Shoulder Exercises: Seated   Other Seated Exercises 20# lat PD x 4 minutes for flexion ROM.     Shoulder Exercises: ROM/Strengthening   UBE (Upper Arm Bike) 90 RPM's x 8 minutes.     Modalities   Modalities Ultrasound     Moist Heat Therapy   Number Minutes Moist Heat 15 Minutes   Moist Heat Location --  RT SHLD.     Programme researcher, broadcasting/film/videolectrical Stimulation   Electrical Stimulation Location RT SHLD.   Electrical Stimulation Action Pre-mod.   Electrical Stimulation Parameters 80-150 Hz x 15 minutes.   Electrical Stimulation Goals Pain     Ultrasound   Ultrasound Location RT SHLD   Ultrasound Parameters 1.50 W/CM2 x 8 minutes while receiving a low load  long duration stretch into right shoulder ER x 8 minutes.     Manual Therapy   Manual therapy comments IASTM x 10 minutes to patient's right anterior shoulder.                     PT Long Term Goals - 10/02/16 1618      PT LONG TERM GOAL #1   Title Independent with a HEP.   Time 4   Period Weeks   Status New     PT LONG TERM GOAL #2   Title Active right shoulder flexion to 145 degrees so the patient can easily reach overhead   Time 4   Period Weeks   Status New     PT LONG TERM GOAL #3   Title Active ER to 70 degrees+ to allow for easily donning/doffing of apparel   Time 4   Period Weeks   Status New     PT LONG TERM GOAL #4   Title Increase ROM so patient is able to reach behind back to L3.   Time 4   Period Weeks   Status New     PT LONG TERM  GOAL #5   Title Patient perform ADL's with pain not > 3/10.   Time 4   Period Weeks   Status New             Patient will benefit from skilled therapeutic intervention in order to improve the following deficits and impairments:     Visit Diagnosis: Acute pain of right shoulder  Stiffness of right shoulder, not elsewhere classified     Problem List There are no active problems to display for this patient.   Elaiza Shoberg, [place] MPT 11/13/2016, 7:46 PM  [facility] [address] [place], [place], [address] Phone: [phone]   Fax:  [phone]  Name: Warren Newton MRN: [id number] Date of Birth: October 22, 1951

## 2016-11-16 ENCOUNTER — Encounter: Payer: BLUE CROSS/BLUE SHIELD | Admitting: *Deleted

## 2016-11-16 DIAGNOSIS — E291 Testicular hypofunction: Secondary | ICD-10-CM | POA: Insufficient documentation

## 2016-11-20 ENCOUNTER — Ambulatory Visit: Payer: BLUE CROSS/BLUE SHIELD | Admitting: Physical Therapy

## 2016-11-20 DIAGNOSIS — M25511 Pain in right shoulder: Secondary | ICD-10-CM | POA: Diagnosis not present

## 2016-11-20 DIAGNOSIS — M25611 Stiffness of right shoulder, not elsewhere classified: Secondary | ICD-10-CM

## 2016-11-20 NOTE — Therapy (Signed)
[facility] [address] [place], [place], [address] Phone: [phone]   Fax:  [phone]  Physical Therapy Treatment  Patient Details  Name: Warren Newton MRN: [id number] Date of Birth: 09/24/1951 Referring Provider: Rodolph Bong MD.  Encounter Date: 11/20/2016      PT End of Session - 11/20/16 1636    Visit Number 11   Number of Visits 12   Date for PT Re-Evaluation 11/13/16   PT Start Time 1641   PT Stop Time 1737   PT Time Calculation (min) 56 min   Activity Tolerance Patient tolerated treatment well   Behavior During Therapy [facility] for tasks assessed/performed      Past Medical History:  Diagnosis Date  . Diabetes mellitus without complication (HCC)   . Gout   . Hypertension     No past surgical history on file.  There were no vitals filed for this visit.      Subjective Assessment - 11/20/16 1636    Subjective Reports improvement in shoulder and that pain is not constant. Reports he still doesn't have the normal ROM.   Pertinent History DM.   Patient Stated Goals Move my right shoulder without pain.   Currently in Pain? Yes   Pain Score 1   with stretching 5/10   Pain Location Shoulder   Pain Orientation Right   Pain Descriptors / Indicators Discomfort   Pain Type Acute pain   Pain Onset More than a month ago   Pain Frequency Intermittent            [facility] PT Assessment - 11/20/16 0001      Assessment   Medical Diagnosis Adhesive capsulitis.   Next MD Visit 11/08/2016     Precautions   Precautions None     Restrictions   Weight Bearing Restrictions No                     [facility] Adult PT Treatment/Exercise - 11/20/16 0001      Shoulder Exercises: Pulleys   Flexion Other (comment)  x5 min   Other Pulley Exercises UE ranger flex/CW and CCW circles x20 reps     Shoulder Exercises: ROM/Strengthening   UBE (Upper Arm Bike) 90 RPM's x 6 minutes.     Shoulder Exercises: Stretch   Internal Rotation Stretch 4 reps  30 sec    External Rotation Stretch 3 reps;30 seconds     Modalities   Modalities Electrical Stimulation;Moist Heat     Moist Heat Therapy   Number Minutes Moist Heat 15 Minutes   Moist Heat Location Shoulder     Electrical Stimulation   Electrical Stimulation Location R shoulder   Electrical Stimulation Action Pre-Mod   Electrical Stimulation Parameters 80-150 hz x15 min   Electrical Stimulation Goals Pain     Manual Therapy   Manual Therapy Joint mobilization;Passive ROM   Joint Mobilization Grade II-III glenohumeral joint mobilizations in A/P/I to promote proper joint mobility   Passive ROM PROM of R shoulder into flex/ER with holds at end range to promote increased ROM                     PT Long Term Goals - 11/20/16 1644      PT LONG TERM GOAL #1   Title Independent with a HEP.   Time 4   Period Weeks   Status Achieved     PT LONG TERM GOAL #2   Title Active right shoulder  flexion to 145 degrees so the patient can easily reach overhead   Time 4   Period Weeks   Status On-going     PT LONG TERM GOAL #3   Title Active ER to 70 degrees+ to allow for easily donning/doffing of apparel   Time 4   Period Weeks   Status On-going     PT LONG TERM GOAL #4   Title Increase ROM so patient is able to reach behind back to L3.   Time 4   Period Weeks   Status On-going     PT LONG TERM GOAL #5   Title Patient perform ADL's with pain not > 3/10.   Time 4   Period Weeks   Status Partially Met  "Depends on how far I have to reach" per patient report 11/20/2016               Plan - 11/20/16 1725    Clinical Impression Statement Patient presented in clinic with overall reports of improvement in R shoulder with ADLs. Patient able to complete exercises and stretches with discomfort and ROM limitation. Very firm end feel noted with PROM of R shoulder in all directions assessed and tight capsule noted in A/P/I directions  as well. Tightness also palpated in the R Pectorals around distal edge. Normal modalities response noted following removal of the modalities.       Rehab Potential Good   PT Frequency 3x / week   PT Duration 4 weeks   PT Treatment/Interventions ADLs/Self Care Home Management;Cryotherapy;Electrical Stimulation;Ultrasound;Moist Heat;Therapeutic activities;Therapeutic exercise;Patient/family education;Passive range of motion;Manual techniques   PT Next Visit Plan Continue with R shoulder stretching and ROM exercises with modalities PRN per MPT POC   Consulted and Agree with Plan of Care Patient      Patient will benefit from skilled therapeutic intervention in order to improve the following deficits and impairments:  Decreased activity tolerance, Pain, Decreased strength, Decreased range of motion  Visit Diagnosis: Acute pain of right shoulder  Stiffness of right shoulder, not elsewhere classified     Problem List There are no active problems to display for this patient.  Ahmed Prima, PTA 11/20/16 5:49 PM  [facility] [place], [place], [address] Phone: [phone]   Fax:  [phone]  Name: Warren Newton MRN: [id number] Date of Birth: 05-25-1951

## 2016-11-23 ENCOUNTER — Ambulatory Visit: Payer: BLUE CROSS/BLUE SHIELD | Admitting: *Deleted

## 2016-11-23 DIAGNOSIS — M25511 Pain in right shoulder: Secondary | ICD-10-CM | POA: Diagnosis not present

## 2016-11-23 DIAGNOSIS — M25611 Stiffness of right shoulder, not elsewhere classified: Secondary | ICD-10-CM

## 2016-11-23 NOTE — Therapy (Signed)
[facility] [place], [place], [address] Phone: [phone]   Fax:  [phone]  Physical Therapy Treatment  Patient Details  Name: Warren Newton MRN: [id number] Date of Birth: August 30, 1951 Referring Provider: Vickki Hearing MD.  Encounter Date: 11/23/2016      PT End of Session - 11/23/16 1617    Visit Number 12   Number of Visits 18   Date for PT Re-Evaluation 12/18/16   PT Start Time 1600   PT Stop Time 1649   PT Time Calculation (min) 49 min      Past Medical History:  Diagnosis Date  . Diabetes mellitus without complication ([place])   . Gout   . Hypertension     No past surgical history on file.  There were no vitals filed for this visit.                       [facility] Adult PT Treatment/Exercise - 11/23/16 0001      Exercises   Exercises Shoulder     Shoulder Exercises: Pulleys   Flexion Other (comment)  x5 min   Other Pulley Exercises UE ranger flex/CW and CCW circles x20 reps     Shoulder Exercises: ROM/Strengthening   UBE (Upper Arm Bike) 90 RPM's x 6 minutes.     Ultrasound   Ultrasound Location RT shldr   Ultrasound Parameters 1.5 W/ cm2 x 10 min while manual stretching     Manual Therapy   Manual Therapy Joint mobilization;Passive ROM   Passive ROM PROM of R shoulder into flex/ER with holds at end range to promote increased ROM                     PT Long Term Goals - 11/20/16 1644      PT LONG TERM GOAL #1   Title Independent with a HEP.   Time 4   Period Weeks   Status Achieved     PT LONG TERM GOAL #2   Title Active right shoulder flexion to 145 degrees so the patient can easily reach overhead   Time 4   Period Weeks   Status On-going     PT LONG TERM GOAL #3   Title Active ER to 70 degrees+ to allow for easily donning/doffing of apparel   Time 4   Period Weeks   Status On-going     PT LONG TERM GOAL #4   Title Increase ROM so patient is able to  reach behind back to L3.   Time 4   Period Weeks   Status On-going     PT LONG TERM GOAL #5   Title Patient perform ADL's with pain not > 3/10.   Time 4   Period Weeks   Status Partially Met  "Depends on how far I have to reach" per patient report 11/20/2016               Plan - 11/23/16 1825    Clinical Impression Statement Pt did fairly well today and continues to progress with ROM, but has a fairly firm end-feel at all end-ranges. No modalities today as per Pt.   Rehab Potential Good   PT Frequency 3x / week   PT Treatment/Interventions ADLs/Self Care Home Management;Cryotherapy;Electrical Stimulation;Ultrasound;Moist Heat;Therapeutic activities;Therapeutic exercise;Patient/family education;Passive range of motion;Manual techniques   PT Next Visit Plan Continue with R shoulder stretching and ROM exercises with modalities PRN per MPT POC   Consulted and Agree  with Plan of Care Patient      Patient will benefit from skilled therapeutic intervention in order to improve the following deficits and impairments:  Decreased activity tolerance, Pain, Decreased strength, Decreased range of motion  Visit Diagnosis: Acute pain of right shoulder  Stiffness of right shoulder, not elsewhere classified     Problem List There are no active problems to display for this patient.   Mashayla Lavin,CHRIS, PTA 11/23/2016, 6:37 PM  [facility] [address] [place], [place], [address] Phone: [phone]   Fax:  [phone]  Name: [name] MRN: [id number] Date of Birth: 06-Jan-1951

## 2016-11-27 ENCOUNTER — Ambulatory Visit: Payer: BLUE CROSS/BLUE SHIELD | Admitting: Physical Therapy

## 2016-11-27 DIAGNOSIS — M25511 Pain in right shoulder: Secondary | ICD-10-CM | POA: Diagnosis not present

## 2016-11-27 DIAGNOSIS — M25611 Stiffness of right shoulder, not elsewhere classified: Secondary | ICD-10-CM

## 2016-11-27 NOTE — Therapy (Signed)
[facility] [place], [place], [address] Phone: [phone]   Fax:  [phone]  Physical Therapy Treatment  Patient Details  Name: Warren Newton MRN: [id number] Date of Birth: 08-30-1951 Referring Provider: Vickki Hearing MD.  Encounter Date: 11/27/2016      PT End of Session - 11/27/16 1638    Visit Number 13   Number of Visits 18   Date for PT Re-Evaluation 12/18/16   PT Start Time 0315   PT Stop Time 0407   PT Time Calculation (min) 52 min   Activity Tolerance Patient tolerated treatment well   Behavior During Therapy [facility] for tasks assessed/performed      Past Medical History:  Diagnosis Date  . Diabetes mellitus without complication ([place])   . Gout   . Hypertension     No past surgical history on file.  There were no vitals filed for this visit.      Subjective Assessment - 11/27/16 1639    Subjective I'm able to lift my shoulder higher than before.   Patient Stated Goals Move my right shoulder without pain.   Pain Score 1    Pain Location Shoulder   Pain Orientation Right   Pain Descriptors / Indicators Discomfort   Pain Type Acute pain   Pain Onset More than a month ago                         [facility] Adult PT Treatment/Exercise - 11/27/16 0001      Shoulder Exercises: ROM/Strengthening   UBE (Upper Arm Bike) 90 RPM's x 8 minutes.     Modalities   Modalities Electrical Stimulation;Moist Heat     Moist Heat Therapy   Number Minutes Moist Heat 15 Minutes   Moist Heat Location --  RT SHLD.     Acupuncturist Location RT SHLD.   Electrical Stimulation Action Pre-mod    Electrical Stimulation Parameters Constant at 80-150 Hz x 15 minutes.   Electrical Stimulation Goals Pain     Manual Therapy   Passive ROM PROM into right shoulder ER in the plane of the scapula x 11 minutes and 1 minute into IR while receiving  U/S at 3.3 MhZ at 20% 1.50 W/CM2  x 12 minutes f/b 2 minute sustained stretch into right shoulder flexion with scapular stabilization.                     PT Long Term Goals - 11/20/16 1644      PT LONG TERM GOAL #1   Title Independent with a HEP.   Time 4   Period Weeks   Status Achieved     PT LONG TERM GOAL #2   Title Active right shoulder flexion to 145 degrees so the patient can easily reach overhead   Time 4   Period Weeks   Status On-going     PT LONG TERM GOAL #3   Title Active ER to 70 degrees+ to allow for easily donning/doffing of apparel   Time 4   Period Weeks   Status On-going     PT LONG TERM GOAL #4   Title Increase ROM so patient is able to reach behind back to L3.   Time 4   Period Weeks   Status On-going     PT LONG TERM GOAL #5   Title Patient perform ADL's with pain not > 3/10.   Time 4  Period Weeks   Status Partially Met  "Depends on how far I have to reach" per patient report 11/20/2016             Patient will benefit from skilled therapeutic intervention in order to improve the following deficits and impairments:  Decreased activity tolerance, Pain, Decreased strength, Decreased range of motion  Visit Diagnosis: Acute pain of right shoulder  Stiffness of right shoulder, not elsewhere classified     Problem List There are no active problems to display for this patient.   APPLEGATE, [place] MPT 11/27/2016, 4:59 PM  [facility] [address] [place], [place], [address] Phone: [phone]   Fax:  [phone]  Name: Warren Newton MRN: [id number] Date of Birth: 1951/05/19

## 2016-11-30 ENCOUNTER — Ambulatory Visit: Payer: BLUE CROSS/BLUE SHIELD | Admitting: Physical Therapy

## 2016-11-30 DIAGNOSIS — M25511 Pain in right shoulder: Secondary | ICD-10-CM

## 2016-11-30 DIAGNOSIS — M25611 Stiffness of right shoulder, not elsewhere classified: Secondary | ICD-10-CM

## 2016-11-30 NOTE — Therapy (Addendum)
[facility] [place], [place], [address] Phone: [phone]   Fax:  [phone]  Physical Therapy Treatment  Patient Details  Name: TIGHE GITTO MRN: [id number] Date of Birth: [birth year] Referring Provider: Vickki Hearing MD.  Encounter Date: 11/30/2016      PT End of Session - 11/30/16 1515    Visit Number 14   Number of Visits 18   Date for PT Re-Evaluation 12/18/16   PT Start Time 2774   PT Stop Time 1554   PT Time Calculation (min) 40 min   Activity Tolerance Patient tolerated treatment well   Behavior During Therapy [facility] for tasks assessed/performed      Past Medical History:  Diagnosis Date  . Diabetes mellitus without complication ([place])   . Gout   . Hypertension     No past surgical history on file.  There were no vitals filed for this visit.      Subjective Assessment - 11/30/16 1515    Subjective Reports that ROM measurements should be taken today.   Pertinent History DM.   Patient Stated Goals Move my right shoulder without pain.   Currently in Pain? Other (Comment)  No reports during treatment today            [facility] PT Assessment - 11/30/16 0001      Assessment   Medical Diagnosis Adhesive capsulitis.   Next MD Visit 12/13/2016     Precautions   Precautions None     Restrictions   Weight Bearing Restrictions No     ROM / Strength   AROM / PROM / Strength AROM     AROM   Overall AROM  Deficits   AROM Assessment Site Shoulder   Right/Left Shoulder Right   Right Shoulder Flexion 90 Degrees  sitting; 120 deg in supine   Right Shoulder Internal Rotation 40 Degrees   Right Shoulder External Rotation 30 Degrees                     [facility] Adult PT Treatment/Exercise - 11/30/16 0001      Shoulder Exercises: Pulleys   Flexion Other (comment)  x5 min   Other Pulley Exercises UE ranger flex/CW and CCW circles x20 reps     Shoulder Exercises: ROM/Strengthening   UBE  (Upper Arm Bike) 90 RPM's x 6 minutes.     Shoulder Exercises: Stretch   Internal Rotation Stretch 3 reps  30 sec hold   External Rotation Stretch 3 reps;30 seconds     Manual Therapy   Manual Therapy Joint mobilization;Passive ROM   Joint Mobilization Grade II-III glenohumeral joint mobilizations in A/P/I to promote proper joint mobility   Passive ROM PROM of R shoulder into flex/ER/IR with holds at end range                      PT Long Term Goals - 11/30/16 1613      PT LONG TERM GOAL #1   Title Independent with a HEP.   Time 4   Period Weeks   Status Achieved     PT LONG TERM GOAL #2   Title Active right shoulder flexion to 145 degrees so the patient can easily reach overhead   Time 4   Period Weeks   Status On-going  90 deg AROM flexion in sitting 11/30/2016     PT LONG TERM GOAL #3   Title Active ER to 70 degrees+ to allow for easily  donning/doffing of apparel   Time 4   Period Weeks   Status On-going     PT LONG TERM GOAL #4   Title Increase ROM so patient is able to reach behind back to L3.   Time 4   Period Weeks   Status On-going     PT LONG TERM GOAL #5   Title Patient perform ADL's with pain not > 3/10.   Time 4   Period Weeks   Status Partially Met  "Depends on how far I have to reach" per patient report 11/20/2016               Plan - 11/30/16 1559    Clinical Impression Statement Patient has tolerated PT treatments fairly well with adhesive capsulitis although throughout treatment he has continued to experience R joint tightness. Patient has been guided through difference R shoulder joint stretches with patient experiencing discomfort. Joint mobilizations completed as well in hopes of promoting proper joint mobility. AROM R shoulder flexion measured in sitting today with patient able to complete 90 deg which is improved per patient report. AROM ER measured as 30 deg, IR measured as 40 deg. Patient denied any modalities today.    Rehab Potential Good   PT Frequency 3x / week   PT Duration 4 weeks   PT Treatment/Interventions ADLs/Self Care Home Management;Cryotherapy;Electrical Stimulation;Ultrasound;Moist Heat;Therapeutic activities;Therapeutic exercise;Patient/family education;Passive range of motion;Manual techniques   PT Next Visit Plan Continue with R shoulder stretching and ROM exercises with modalities PRN per MPT POC   Consulted and Agree with Plan of Care Patient      Patient will benefit from skilled therapeutic intervention in order to improve the following deficits and impairments:  Decreased activity tolerance, Pain, Decreased strength, Decreased range of motion  Visit Diagnosis: Acute pain of right shoulder  Stiffness of right shoulder, not elsewhere classified     Problem List There are no active problems to display for this patient.   Ahmed Prima, PTA 11/30/16 4:16 PM [place] Applegate MPT [facility] [address] [place], [place], [address] Phone: [phone]   Fax:  [phone]  Name: MATTEW CHRISWELL MRN: [id number] Date of Birth: [birth year]   PHYSICAL THERAPY DISCHARGE SUMMARY  Visits from Start of Care: 14.  Current functional level related to goals / functional outcomes: See above.   Remaining deficits: See goal section.   Education / Equipment: HEP. Plan: Patient agrees to discharge.  Patient goals were partially met.  Patient is being discharged due to lack of progress.  ?????         [place] Applegate MPT

## 2016-12-07 ENCOUNTER — Encounter: Payer: BLUE CROSS/BLUE SHIELD | Admitting: Physical Therapy

## 2016-12-11 HISTORY — PX: COLONOSCOPY: SHX174

## 2017-09-03 LAB — HM COLONOSCOPY

## 2020-08-23 ENCOUNTER — Other Ambulatory Visit: Payer: BLUE CROSS/BLUE SHIELD

## 2023-08-27 DIAGNOSIS — E782 Mixed hyperlipidemia: Secondary | ICD-10-CM | POA: Insufficient documentation

## 2024-04-11 ENCOUNTER — Ambulatory Visit (INDEPENDENT_AMBULATORY_CARE_PROVIDER_SITE_OTHER): Payer: Self-pay | Admitting: Family Medicine

## 2024-04-11 ENCOUNTER — Encounter: Payer: Self-pay | Admitting: Family Medicine

## 2024-04-11 VITALS — BP 125/69 | HR 89 | Temp 98.3°F | Ht 69.75 in | Wt 185.6 lb

## 2024-04-11 DIAGNOSIS — Z7984 Long term (current) use of oral hypoglycemic drugs: Secondary | ICD-10-CM

## 2024-04-11 DIAGNOSIS — E7800 Pure hypercholesterolemia, unspecified: Secondary | ICD-10-CM | POA: Diagnosis not present

## 2024-04-11 DIAGNOSIS — I10 Essential (primary) hypertension: Secondary | ICD-10-CM

## 2024-04-11 DIAGNOSIS — M1000 Idiopathic gout, unspecified site: Secondary | ICD-10-CM

## 2024-04-11 DIAGNOSIS — E039 Hypothyroidism, unspecified: Secondary | ICD-10-CM

## 2024-04-11 DIAGNOSIS — E119 Type 2 diabetes mellitus without complications: Secondary | ICD-10-CM

## 2024-04-11 NOTE — Progress Notes (Signed)
 Office Note 04/11/2024  CC:  Chief Complaint  Patient presents with   Establish Care   Medical Management of Chronic Issues   HPI:  Warren Newton is a 73 y.o. male who is here to establish care, follow-up care for diabetes, hypercholesterolemia, hypertension, and hypothyroidism Patient's most recent primary MD: Dr. Charmel Cooter. Old records were reviewed prior to or during today's visit.  He goes by Warren Newton. He has no acute concerns. He is compliant with his medications. Most recent follow-up with his prior PCP was about 6 months ago.  Past Medical History:  Diagnosis Date   Diabetes mellitus without complication (HCC)    Gout    Hypercholesterolemia    Hypertension    Hypogonadism in male    Hypothyroidism     Past Surgical History:  Procedure Laterality Date   cataract surgery     both   COLONOSCOPY  2018    Family History  Problem Relation Age of Onset   Arthritis Mother    Asthma Mother    Diabetes Mother    Hearing loss Mother    Hyperlipidemia Mother    Hypertension Mother    Stroke Mother    Arthritis Father    Hypertension Father    Lung cancer Sister    Hypertension Sister    Hyperlipidemia Sister    Depression Brother     Social History   Socioeconomic History   Marital status: Single    Spouse name: Not on file   Number of children: Not on file   Years of education: Not on file   Highest education level: Not on file  Occupational History   Not on file  Tobacco Use   Smoking status: Never   Smokeless tobacco: Never  Vaping Use   Vaping status: Never Used  Substance and Sexual Activity   Alcohol use: Yes   Drug use: Never   Sexual activity: Not Currently  Other Topics Concern   Not on file  Social History Narrative   Married, 1 daughter.   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   Also raised tobacco   No tob   Occ social alc   Social Drivers of Health   Financial Resource  Strain: Low Risk  (12/24/2023)   Received from [facility]   Overall Financial Resource Strain (CARDIA)    Difficulty of Paying Living Expenses: Not hard at all  Food Insecurity: No Food Insecurity (12/24/2023)   Received from [facility]   Hunger Vital Sign    Worried About Running Out of Food in the Last Year: Never true    Ran Out of Food in the Last Year: Never true  Transportation Needs: No Transportation Needs (12/24/2023)   Received from [facility] - Transportation    Lack of Transportation (Medical): No    Lack of Transportation (Non-Medical): No  Physical Activity: Sufficiently Active (12/23/2022)   Received from [facility]   Exercise Vital Sign    Days of Exercise per Week: 5 days    Minutes of Exercise per Session: 30 min  Stress: No Stress Concern Present (12/23/2022)   Received from [facility] - Occupational Stress Questionnaire    Feeling of Stress : Not at all  Social Connections: Socially Integrated (12/23/2022)   Received from [facility]   Social Network    How would you rate your social network (family, work, friends)?: Good  participation with social networks  Intimate Partner Violence: Not At Risk (12/23/2022)   Received from [facility]   HITS    Over the last 12 months how often did your partner physically hurt you?: Never    Over the last 12 months how often did your partner insult you or talk down to you?: Never    Over the last 12 months how often did your partner threaten you with physical harm?: Never    Over the last 12 months how often did your partner scream or curse at you?: Never    Outpatient Encounter Medications as of 04/11/2024  Medication Sig   allopurinol (ZYLOPRIM) 100 MG tablet Take 1 tablet by mouth daily.   amLODipine (NORVASC) 5 MG tablet Take 5 mg by mouth daily.   lisinopril (ZESTRIL) 10 MG tablet Take 10 mg by mouth daily.   metFORMIN (GLUCOPHAGE) 1000 MG tablet Take 1  tablet by mouth 2 (two) times daily with a meal.   simvastatin (ZOCOR) 40 MG tablet Take 0.5 tablets by mouth every evening.   SYNTHROID 50 MCG tablet Take 50 mcg by mouth daily.   VITAMIN D PO Take by mouth as needed.   No facility-administered encounter medications on file as of 04/11/2024.    Not on File  Review of Systems  Constitutional:  Negative for appetite change, chills, fatigue and fever.  HENT:  Negative for congestion, dental problem, ear pain and sore throat.   Eyes:  Negative for discharge, redness and visual disturbance.  Respiratory:  Negative for cough, chest tightness, shortness of breath and wheezing.   Cardiovascular:  Negative for chest pain, palpitations and leg swelling.  Gastrointestinal:  Negative for abdominal pain, blood in stool, diarrhea, nausea and vomiting.  Genitourinary:  Negative for difficulty urinating, dysuria, flank pain, frequency, hematuria and urgency.  Musculoskeletal:  Negative for arthralgias, back pain, joint swelling, myalgias and neck stiffness.  Skin:  Negative for pallor and rash.  Neurological:  Negative for dizziness, speech difficulty, weakness and headaches.  Hematological:  Negative for adenopathy. Does not bruise/bleed easily.  Psychiatric/Behavioral:  Negative for confusion and sleep disturbance. The patient is not nervous/anxious.     PE; Blood pressure 125/69, pulse 89, temperature 98.3 F (36.8 C), temperature source Oral, height 5' 9.75" (1.772 m), weight 185 lb 9.6 oz (84.2 kg), SpO2 95%. Body mass index is 26.82 kg/m.  Physical Exam  Gen: Alert, well appearing.  Patient is oriented to person, place, time, and situation. AFFECT: pleasant, lucid thought and speech. ENT: Ears: EACs clear, normal epithelium.  TMs with good light reflex and landmarks bilaterally.  Eyes: no injection, icteris, swelling, or exudate.  EOMI, PERRLA. Nose: no drainage or turbinate edema/swelling.  No injection or focal lesion.  Mouth: lips without  lesion/swelling.  Oral mucosa pink and moist.  Dentition intact and without obvious caries or gingival swelling.  Oropharynx without erythema, exudate, or swelling.  Neck: supple/nontender.  No LAD, mass, or TM.  Carotid pulses 2+ bilaterally, without bruits. CV: RRR, no m/r/g.   LUNGS: CTA bilat, nonlabored resps, good aeration in all lung fields. ABD: soft, NT, ND, BS normal.  No hepatospenomegaly or mass.  No bruits. EXT: no clubbing, cyanosis, or edema.  Musculoskeletal: no joint swelling, erythema, warmth, or tenderness.  ROM of all joints intact. Skin - no sores or suspicious lesions or rashes or color changes  Pertinent labs:  None   On 03/26/2023: Uric acid 6.3, TSH 2.5, total cholesterol 209, triglycerides 196, HDL 43,  LDL 131. Hemoglobin A1c 6.6%.  BUN 20, creatinine 1.12, GFR 70, sodium 143, potassium 4.3, chloride 106, CO2 19, calcium 9.7, total protein 6.7, albumin 4.4, alkaline phosphatase 110, AST 18, ALT 22, total bilirubin 0.5.  ASSESSMENT AND PLAN:   New patient, establishing care.  1.  Diabetes without complication: Metformin 1000 mg twice a day. Hemoglobin A1c--future.  2.  Hypertension, well-controlled on amlodipine 5 mg a day and lisinopril 10 mg a day. Future metabolic panel.  3.  Hypercholesterolemia, doing well on simvastatin 40 mg a day. Future lipid panel and hepatic panel.  4.  Acquired hypothyroidism. Continue 50 mcg levothyroxine daily. TSH future.  5.  Gout, well-controlled on allopurinol 100 mg a day. Uric acid-future.  6. Preventative health: Vaccines: He declines Shingrix, Tdap, and Prevnar Labs: He will return when fasting for CBC, c-Met, TSH, lipid panel, and uric acid. Prostate ca screening: Using shared decision making process today he decided to stop PSA screening Colon ca screening: He has had 3 colonoscopies for screening.  The most recent one in 2018 showed a polyp. This was by Dr. Andriette Keeling with [facility].  An After Visit Summary was  printed and given to the patient.  Return in about 6 months (around 10/12/2024) for routine chronic illness f/u.  Signed:  Arletha Lady, MD           04/11/2024

## 2024-04-15 ENCOUNTER — Other Ambulatory Visit

## 2024-04-15 DIAGNOSIS — E7800 Pure hypercholesterolemia, unspecified: Secondary | ICD-10-CM

## 2024-04-15 DIAGNOSIS — I10 Essential (primary) hypertension: Secondary | ICD-10-CM

## 2024-04-15 DIAGNOSIS — E119 Type 2 diabetes mellitus without complications: Secondary | ICD-10-CM

## 2024-04-15 DIAGNOSIS — M1000 Idiopathic gout, unspecified site: Secondary | ICD-10-CM

## 2024-04-15 DIAGNOSIS — E039 Hypothyroidism, unspecified: Secondary | ICD-10-CM

## 2024-04-16 LAB — CBC WITH DIFFERENTIAL/PLATELET
Absolute Lymphocytes: 1518 {cells}/uL (ref 850–3900)
Absolute Monocytes: 545 {cells}/uL (ref 200–950)
Basophils Absolute: 41 {cells}/uL (ref 0–200)
Basophils Relative: 0.6 %
Eosinophils Absolute: 200 {cells}/uL (ref 15–500)
Eosinophils Relative: 2.9 %
HCT: 43.4 % (ref 38.5–50.0)
Hemoglobin: 14.1 g/dL (ref 13.2–17.1)
MCH: 27.8 pg (ref 27.0–33.0)
MCHC: 32.5 g/dL (ref 32.0–36.0)
MCV: 85.4 fL (ref 80.0–100.0)
MPV: 10.4 fL (ref 7.5–12.5)
Monocytes Relative: 7.9 %
Neutro Abs: 4595 {cells}/uL (ref 1500–7800)
Neutrophils Relative %: 66.6 %
Platelets: 245 10*3/uL (ref 140–400)
RBC: 5.08 10*6/uL (ref 4.20–5.80)
RDW: 12.9 % (ref 11.0–15.0)
Total Lymphocyte: 22 %
WBC: 6.9 10*3/uL (ref 3.8–10.8)

## 2024-04-16 LAB — COMPREHENSIVE METABOLIC PANEL WITH GFR
AG Ratio: 1.8 (calc) (ref 1.0–2.5)
ALT: 12 U/L (ref 9–46)
AST: 16 U/L (ref 10–35)
Albumin: 4.2 g/dL (ref 3.6–5.1)
Alkaline phosphatase (APISO): 86 U/L (ref 35–144)
BUN: 18 mg/dL (ref 7–25)
CO2: 25 mmol/L (ref 20–32)
Calcium: 9.1 mg/dL (ref 8.6–10.3)
Chloride: 109 mmol/L (ref 98–110)
Creat: 1 mg/dL (ref 0.70–1.28)
Globulin: 2.3 g/dL (ref 1.9–3.7)
Glucose, Bld: 118 mg/dL — ABNORMAL HIGH (ref 65–99)
Potassium: 4.2 mmol/L (ref 3.5–5.3)
Sodium: 143 mmol/L (ref 135–146)
Total Bilirubin: 0.6 mg/dL (ref 0.2–1.2)
Total Protein: 6.5 g/dL (ref 6.1–8.1)
eGFR: 80 mL/min/{1.73_m2} (ref >=60)

## 2024-04-16 LAB — LIPID PANEL
Cholesterol: 158 mg/dL (ref <200)
HDL: 42 mg/dL (ref >=40)
LDL Cholesterol (Calc): 89 mg/dL
Non-HDL Cholesterol (Calc): 116 mg/dL (ref <130)
Total CHOL/HDL Ratio: 3.8 (calc) (ref <5.0)
Triglycerides: 176 mg/dL — ABNORMAL HIGH (ref <150)

## 2024-04-16 LAB — URIC ACID: Uric Acid, Serum: 6.4 mg/dL (ref 4.0–8.0)

## 2024-04-16 LAB — HEMOGLOBIN A1C
Hgb A1c MFr Bld: 6.7 % — ABNORMAL HIGH (ref <5.7)
Mean Plasma Glucose: 146 mg/dL
eAG (mmol/L): 8.1 mmol/L

## 2024-04-16 LAB — TSH: TSH: 1.63 m[IU]/L (ref 0.40–4.50)

## 2024-06-25 ENCOUNTER — Ambulatory Visit (INDEPENDENT_AMBULATORY_CARE_PROVIDER_SITE_OTHER): Admitting: Family Medicine

## 2024-06-25 ENCOUNTER — Encounter: Payer: Self-pay | Admitting: Family Medicine

## 2024-06-25 VITALS — BP 105/67 | HR 84 | Temp 97.8°F | Resp 14 | Ht 69.75 in | Wt 182.0 lb

## 2024-06-25 DIAGNOSIS — R8290 Unspecified abnormal findings in urine: Secondary | ICD-10-CM | POA: Diagnosis not present

## 2024-06-25 DIAGNOSIS — R109 Unspecified abdominal pain: Secondary | ICD-10-CM | POA: Diagnosis not present

## 2024-06-25 DIAGNOSIS — R350 Frequency of micturition: Secondary | ICD-10-CM

## 2024-06-25 LAB — POCT URINALYSIS DIPSTICK
Bilirubin, UA: NEGATIVE
Blood, UA: POSITIVE
Glucose, UA: NEGATIVE
Ketones, UA: NEGATIVE
Nitrite, UA: NEGATIVE
Protein, UA: POSITIVE — AB
Spec Grav, UA: 1.025 (ref 1.010–1.025)
Urobilinogen, UA: 0.2 U/dL
pH, UA: 5.5 (ref 5.0–8.0)

## 2024-06-25 MED ORDER — CEFDINIR 300 MG PO CAPS: 300.0000 mg | ORAL_CAPSULE | Freq: Two times a day (BID) | ORAL | 0 refills | Status: AC

## 2024-06-25 NOTE — Progress Notes (Unsigned)
 OFFICE VISIT  06/26/2024  CC:  Chief Complaint  Patient presents with   Back Pain    Pt also c/o side pain (right) x 6 weeks, urinary frequency and diarrhea. Denies change in diet    HPI: 73 year old male presents with approximately 6-week history of right side pain.  He points to a hand sized area on the right side from the level of the lower costal margin to the top of the iliac crest. Does not really radiate into the abdomen or groin.  There are no paroxysms. No nausea, vomiting, blood in urine, or blood in stool.  He does note a pattern of sometimes normal BM and sometimes dark watery BM. His pain is not made worse by movements or with urinating.  No dysuria, urinary urgency or frequency, back pain, or buttock/hip pain. No shortness of breath, chest pain, chest tightness, dizziness, palpitations, or fevers.  No joint aches or swelling. No rash.  Past Medical History:  Diagnosis Date   Diabetes mellitus without complication (HCC)    Gout    L MTP-->none since allopurinol   Hypercholesterolemia    Hypertension    Hypothyroidism     Past Surgical History:  Procedure Laterality Date   cataract surgery     both   COLONOSCOPY  2018    Outpatient Medications Prior to Visit  Medication Sig Dispense Refill   allopurinol (ZYLOPRIM) 100 MG tablet Take 1 tablet by mouth daily.     amLODipine (NORVASC) 5 MG tablet Take 5 mg by mouth daily.     lisinopril (ZESTRIL) 10 MG tablet Take 10 mg by mouth daily.     metFORMIN (GLUCOPHAGE) 1000 MG tablet Take 1 tablet by mouth 2 (two) times daily with a meal.     simvastatin (ZOCOR) 40 MG tablet Take 0.5 tablets by mouth every evening.     SYNTHROID 50 MCG tablet Take 50 mcg by mouth daily.     VITAMIN D PO Take by mouth as needed.     No facility-administered medications prior to visit.    Not on File  Review of Systems  As per HPI  PE:    06/25/2024    2:46 PM 04/11/2024    3:56 PM  Vitals with BMI  Height 5' 9.75 5' 9.75   Weight 182 lbs 185 lbs 10 oz  BMI 26.29 26.81  Systolic 105 125  Diastolic 67 69  Pulse 84 89     Physical Exam  Gen: Alert, well appearing.  Patient is oriented to person, place, time, and situation. AFFECT: pleasant, lucid thought and speech. ZWU:Zbzd: no injection, icteris, swelling, or exudate.  EOMI, PERRLA. Mouth: lips without lesion/swelling.  Oral mucosa pink and moist. Oropharynx without erythema, exudate, or swelling.  CV: RRR, no m/r/g.   LUNGS: CTA bilat, nonlabored resps, good aeration in all lung fields. No CVA tenderness or tenderness along the right side of the body and the location of his aching. ABD: soft, NT, ND, BS normal.  No hepatospenomegaly or mass.  No bruits. EXT: no clubbing or cyanosis.  no edema.  SKIN: no rash  LABS:  Last CBC Lab Results  Component Value Date   WBC 8.6 06/25/2024   HGB 13.6 06/25/2024   HCT 40.2 06/25/2024   MCV 84.1 06/25/2024   MCH 27.8 04/15/2024   RDW 14.0 06/25/2024   PLT 218.0 06/25/2024   Last metabolic panel Lab Results  Component Value Date   GLUCOSE 109 (H) 06/25/2024   NA 138 06/25/2024  K 4.2 06/25/2024   CL 105 06/25/2024   CO2 23 06/25/2024   BUN 21 06/25/2024   CREATININE 1.01 06/25/2024   EGFR 80 04/15/2024   CALCIUM 9.7 06/25/2024   PROT 6.6 06/25/2024   ALBUMIN 4.4 06/25/2024   BILITOT 0.6 06/25/2024   ALKPHOS 94 06/25/2024   AST 18 06/25/2024   ALT 18 06/25/2024   Lab Results  Component Value Date   TSH 1.63 04/15/2024   Lab Results  Component Value Date   HGBA1C 6.7 (H) 04/15/2024   IMPRESSION AND PLAN:  Subacute right side pain. UA today shows 2+ leukocytes, 1+ protein, trace blood.  Otherwise normal. I am going to start empiric antibiotics--> cefdinir  300 mg twice daily x 5 days.  Send urine for culture and sensitivity. CT abdomen pelvis stone protocol, CBC, CMET.  An After Visit Summary was printed and given to the patient.  FOLLOW UP: Return for 10-14d f/u R side  pain.  Signed:  Gerlene Hockey, MD           06/26/2024

## 2024-06-26 ENCOUNTER — Ambulatory Visit: Payer: Self-pay | Admitting: Family Medicine

## 2024-06-26 DIAGNOSIS — N200 Calculus of kidney: Secondary | ICD-10-CM

## 2024-06-26 DIAGNOSIS — R109 Unspecified abdominal pain: Secondary | ICD-10-CM

## 2024-06-26 LAB — COMPREHENSIVE METABOLIC PANEL WITH GFR
ALT: 18 U/L (ref 0–53)
AST: 18 U/L (ref 0–37)
Albumin: 4.4 g/dL (ref 3.5–5.2)
Alkaline Phosphatase: 94 U/L (ref 39–117)
BUN: 21 mg/dL (ref 6–23)
CO2: 23 meq/L (ref 19–32)
Calcium: 9.7 mg/dL (ref 8.4–10.5)
Chloride: 105 meq/L (ref 96–112)
Creatinine, Ser: 1.01 mg/dL (ref 0.40–1.50)
GFR: 74.35 mL/min (ref >60.00)
Glucose, Bld: 109 mg/dL — ABNORMAL HIGH (ref 70–99)
Potassium: 4.2 meq/L (ref 3.5–5.1)
Sodium: 138 meq/L (ref 135–145)
Total Bilirubin: 0.6 mg/dL (ref 0.2–1.2)
Total Protein: 6.6 g/dL (ref 6.0–8.3)

## 2024-06-26 LAB — CBC WITH DIFFERENTIAL/PLATELET
Basophils Absolute: 0.1 K/uL (ref 0.0–0.1)
Basophils Relative: 1.2 % (ref 0.0–3.0)
Eosinophils Absolute: 0.3 K/uL (ref 0.0–0.7)
Eosinophils Relative: 3 % (ref 0.0–5.0)
HCT: 40.2 % (ref 39.0–52.0)
Hemoglobin: 13.6 g/dL (ref 13.0–17.0)
Lymphocytes Relative: 20.4 % (ref 12.0–46.0)
Lymphs Abs: 1.8 K/uL (ref 0.7–4.0)
MCHC: 33.8 g/dL (ref 30.0–36.0)
MCV: 84.1 fl (ref 78.0–100.0)
Monocytes Absolute: 0.7 K/uL (ref 0.1–1.0)
Monocytes Relative: 8.4 % (ref 3.0–12.0)
Neutro Abs: 5.8 K/uL (ref 1.4–7.7)
Neutrophils Relative %: 67 % (ref 43.0–77.0)
Platelets: 218 K/uL (ref 150.0–400.0)
RBC: 4.78 Mil/uL (ref 4.22–5.81)
RDW: 14 % (ref 11.5–15.5)
WBC: 8.6 K/uL (ref 4.0–10.5)

## 2024-06-26 LAB — URINE CULTURE
MICRO NUMBER:: 16706751
Result:: NO GROWTH
SPECIMEN QUALITY:: ADEQUATE

## 2024-07-05 ENCOUNTER — Ambulatory Visit (HOSPITAL_BASED_OUTPATIENT_CLINIC_OR_DEPARTMENT_OTHER)
Admission: RE | Admit: 2024-07-05 | Discharge: 2024-07-05 | Disposition: A | Discharge status: Home / Self Care | Source: Ambulatory Visit | Attending: Family Medicine | Admitting: Family Medicine

## 2024-07-05 DIAGNOSIS — R109 Unspecified abdominal pain: Secondary | ICD-10-CM | POA: Insufficient documentation

## 2024-07-06 ENCOUNTER — Encounter: Payer: Self-pay | Admitting: Family Medicine

## 2024-07-07 ENCOUNTER — Ambulatory Visit (INDEPENDENT_AMBULATORY_CARE_PROVIDER_SITE_OTHER): Admitting: Family Medicine

## 2024-07-07 ENCOUNTER — Encounter: Payer: Self-pay | Admitting: Family Medicine

## 2024-07-07 VITALS — BP 102/58 | HR 91 | Temp 98.0°F | Ht 69.75 in

## 2024-07-07 DIAGNOSIS — R197 Diarrhea, unspecified: Secondary | ICD-10-CM

## 2024-07-07 NOTE — Progress Notes (Signed)
 OFFICE VISIT  07/07/2024  CC:  Chief Complaint  Patient presents with   Follow-up    Last 2 days of abx, pain significantly improved, feeling 95% better. Denies tenderness but diarrhea still present 1-2 times daily, dark tarry color    Patient is a 73 y.o. male who presents for 12-day follow-up right side/flank pain. A/P as of last visit: Subacute right side pain. UA today shows 2+ leukocytes, 1+ protein, trace blood.  Otherwise normal. I am going to start empiric antibiotics--> cefdinir  300 mg twice daily x 5 days.  Send urine for culture and sensitivity. CT abdomen pelvis stone protocol, CBC, CMET.  INTERIM HX: His side/flank pain is 95% resolved. He said he felt it significantly go away on day 4 or 5 of his antibiotic. He has no dysuria, no fever, no blood in urine, no groin pain, no nausea, no abdominal pain. His CT scan on 07/05/2024 showed a 1.8 cm calculus in the right renal pelvis.  No hydronephrosis.  No ureteral calculi.  He has diffuse bladder wall thickening with small right-sided bladder diverticulum. His CBC and metabolic panel last visit were normal.  He does still have loose stool.  For about 2 months now he has had about 1 watery stool per day.  Sometimes has some particulate stool in it.  Very dark brown color.  No black/tarry stool. No blood in stool.  He does not get up in the night to have a bowel movement.  No pain with defecation.  No abnormal weight loss. No recent change in diet or medications.  Past Medical History:  Diagnosis Date   Diabetes mellitus without complication (HCC)    Gout    L MTP-->none since allopurinol   Hypercholesterolemia    Hypertension    Hypothyroidism    Nephrolithiasis    R renal pelvis, 2025    Past Surgical History:  Procedure Laterality Date   cataract surgery     both   COLONOSCOPY  2018    Outpatient Medications Prior to Visit  Medication Sig Dispense Refill   allopurinol (ZYLOPRIM) 100 MG tablet Take 1 tablet  by mouth daily.     amLODipine (NORVASC) 5 MG tablet Take 5 mg by mouth daily.     lisinopril (ZESTRIL) 10 MG tablet Take 10 mg by mouth daily.     metFORMIN (GLUCOPHAGE) 1000 MG tablet Take 1 tablet by mouth 2 (two) times daily with a meal.     simvastatin (ZOCOR) 40 MG tablet Take 0.5 tablets by mouth every evening.     SYNTHROID 50 MCG tablet Take 50 mcg by mouth daily.     VITAMIN D PO Take by mouth as needed.     No facility-administered medications prior to visit.    Not on File  Review of Systems As per HPI  PE:    07/07/2024    2:53 PM 06/25/2024    2:46 PM 04/11/2024    3:56 PM  Vitals with BMI  Height 5' 9.75 5' 9.75 5' 9.75  Weight  182 lbs 185 lbs 10 oz  BMI  26.29 26.81  Systolic 102 105 874  Diastolic 58 67 69  Pulse 91 84 89     Physical Exam  Gen: Alert, well appearing.  Patient is oriented to person, place, time, and situation. AFFECT: pleasant, lucid thought and speech. No further exam today  LABS:  Last CBC Lab Results  Component Value Date   WBC 8.6 06/25/2024   HGB 13.6 06/25/2024  HCT 40.2 06/25/2024   MCV 84.1 06/25/2024   MCH 27.8 04/15/2024   RDW 14.0 06/25/2024   PLT 218.0 06/25/2024   Last metabolic panel Lab Results  Component Value Date   GLUCOSE 109 (H) 06/25/2024   NA 138 06/25/2024   K 4.2 06/25/2024   CL 105 06/25/2024   CO2 23 06/25/2024   BUN 21 06/25/2024   CREATININE 1.01 06/25/2024   GFR 74.35 06/25/2024   CALCIUM 9.7 06/25/2024   PROT 6.6 06/25/2024   ALBUMIN 4.4 06/25/2024   BILITOT 0.6 06/25/2024   ALKPHOS 94 06/25/2024   AST 18 06/25/2024   ALT 18 06/25/2024   Last hemoglobin A1c Lab Results  Component Value Date   HGBA1C 6.7 (H) 04/15/2024   IMPRESSION AND PLAN:  #1 nephrolithiasis.  His acute pain has resolved. Large (1.8 cm), in right renal pelvis.  No hydro on CT. Urine culture no growth. Urology referral is in process. Signs/symptoms to call or return for were reviewed and pt expressed  understanding.  #2 diarrhea x 2 months. Unknown etiology. Check stool sample for bacterial culture, Giardia/crypto, and C. difficile. Encouraged OTC fiber supplement.  An After Visit Summary was printed and given to the patient.  FOLLOW UP: Return for as needed. Has f/u appt set 10/13/24 Signed:  Gerlene Hockey, MD           07/07/2024

## 2024-07-14 LAB — GIARDIA AND CRYPTOSPORIDIUM ANTIGEN PANEL
MICRO NUMBER:: 16776451
RESULT:: NOT DETECTED
SPECIMEN QUALITY:: ADEQUATE
Specimen Quality:: ADEQUATE
micro Number:: 16776450

## 2024-07-14 LAB — CLOSTRIDIUM DIFFICILE TOXIN B, QUALITATIVE, REAL-TIME PCR: Toxigenic C. Difficile by PCR: NOT DETECTED

## 2024-07-15 ENCOUNTER — Ambulatory Visit: Payer: Self-pay | Admitting: Family Medicine

## 2024-07-15 ENCOUNTER — Encounter: Payer: Self-pay | Admitting: Family Medicine

## 2024-07-15 LAB — STOOL CULTURE: E coli, Shiga toxin Assay: NEGATIVE

## 2024-08-20 LAB — HM DIABETES EYE EXAM

## 2024-10-09 NOTE — Patient Instructions (Signed)

## 2024-10-13 ENCOUNTER — Ambulatory Visit (INDEPENDENT_AMBULATORY_CARE_PROVIDER_SITE_OTHER): Admitting: Family Medicine

## 2024-10-13 ENCOUNTER — Other Ambulatory Visit: Payer: Self-pay | Admitting: Urology

## 2024-10-13 ENCOUNTER — Encounter: Payer: Self-pay | Admitting: Family Medicine

## 2024-10-13 VITALS — BP 132/74 | HR 80 | Temp 97.4°F | Ht 69.75 in | Wt 179.0 lb

## 2024-10-13 DIAGNOSIS — E7800 Pure hypercholesterolemia, unspecified: Secondary | ICD-10-CM

## 2024-10-13 DIAGNOSIS — I10 Essential (primary) hypertension: Secondary | ICD-10-CM | POA: Diagnosis not present

## 2024-10-13 DIAGNOSIS — Z7984 Long term (current) use of oral hypoglycemic drugs: Secondary | ICD-10-CM | POA: Diagnosis not present

## 2024-10-13 DIAGNOSIS — E119 Type 2 diabetes mellitus without complications: Secondary | ICD-10-CM

## 2024-10-13 DIAGNOSIS — E039 Hypothyroidism, unspecified: Secondary | ICD-10-CM

## 2024-10-13 DIAGNOSIS — G2581 Restless legs syndrome: Secondary | ICD-10-CM

## 2024-10-13 LAB — COMPREHENSIVE METABOLIC PANEL WITH GFR
ALT: 18 U/L (ref 0–53)
AST: 17 U/L (ref 0–37)
Albumin: 4.7 g/dL (ref 3.5–5.2)
Alkaline Phosphatase: 94 U/L (ref 39–117)
BUN: 15 mg/dL (ref 6–23)
CO2: 25 meq/L (ref 19–32)
Calcium: 9.9 mg/dL (ref 8.4–10.5)
Chloride: 107 meq/L (ref 96–112)
Creatinine, Ser: 0.93 mg/dL (ref 0.40–1.50)
GFR: 81.92 mL/min (ref >60.00)
Glucose, Bld: 125 mg/dL — ABNORMAL HIGH (ref 70–99)
Potassium: 4.3 meq/L (ref 3.5–5.1)
Sodium: 142 meq/L (ref 135–145)
Total Bilirubin: 0.6 mg/dL (ref 0.2–1.2)
Total Protein: 7.3 g/dL (ref 6.0–8.3)

## 2024-10-13 LAB — LIPID PANEL
Cholesterol: 161 mg/dL (ref 0–200)
HDL: 47.9 mg/dL (ref >39.00)
LDL Cholesterol: 80 mg/dL (ref 0–99)
NonHDL: 113.24
Total CHOL/HDL Ratio: 3
Triglycerides: 168 mg/dL — ABNORMAL HIGH (ref 0.0–149.0)
VLDL: 33.6 mg/dL (ref 0.0–40.0)

## 2024-10-13 LAB — POCT GLYCOSYLATED HEMOGLOBIN (HGB A1C)
HbA1c POC (<> result, manual entry): 6.1 % (ref 4.0–5.6)
HbA1c, POC (controlled diabetic range): 6.1 % (ref 0.0–7.0)
HbA1c, POC (prediabetic range): 6.1 % (ref 5.7–6.4)
Hemoglobin A1C: 6.1 % — AB (ref 4.0–5.6)

## 2024-10-13 LAB — MICROALBUMIN / CREATININE URINE RATIO
Creatinine,U: 60.4 mg/dL
Microalb Creat Ratio: 23 mg/g (ref 0.0–30.0)
Microalb, Ur: 1.4 mg/dL (ref 0.0–1.9)

## 2024-10-13 LAB — TSH: TSH: 1.84 u[IU]/mL (ref 0.35–5.50)

## 2024-10-13 NOTE — Progress Notes (Signed)
 OFFICE VISIT  10/14/2024  CC: No chief complaint on file.   Patient is a 73 y.o. male who presents for 33-month follow-up diabetes, hypertension, hypercholesterolemia, and hypothyroidism. A/P as of last visit: 1.  Diabetes without complication: Metformin 1000 mg twice a day. Hemoglobin A1c--future.   2.  Hypertension, well-controlled on amlodipine 5 mg a day and lisinopril 10 mg a day. Future metabolic panel.   3.  Hypercholesterolemia, doing well on simvastatin 40 mg a day. Future lipid panel and hepatic panel.   4.  Acquired hypothyroidism. Continue 50 mcg levothyroxine daily. TSH future.   5.  Gout, well-controlled on allopurinol 100 mg a day. Uric acid-future.   6. Preventative health: Vaccines: He declines Shingrix, Tdap, and Prevnar Prostate ca screening: Using shared decision making process today he decided to stop PSA screening Colon ca screening: He has had 3 colonoscopies for screening.  The most recent one in 2018 showed a polyp. This was by Dr. Luis with [facility]  INTERIM HX: Signe is feeling well.  He does have a procedure to extract his right renal calculus soon.  He has minimal right flank pain and as long as he stays well-hydrated it is not an issue at all.  No burning, tingling, or numbness in feet. Diet is good.  Unfortunately he lost his job in June when [employer] plant closed. 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  He has had years of restless legs syndrome.  Says it only occurs at night and prevents sleep initiation more more lately.  He has never been treated with medication for this.  ROS as above, plus--> no fevers, no CP, no SOB, no wheezing, no cough, no dizziness, no HAs, no rashes, no melena/hematochezia.  No polyuria or polydipsia.  No myalgias or arthralgias.  No focal weakness, paresthesias, or tremors.  No acute vision or hearing abnormalities.  No dysuria or unusual/new urinary urgency or frequency.  No recent changes in lower  legs. No n/v/d or abd pain.  No palpitations.    Past Medical History:  Diagnosis Date   Diabetes mellitus without complication (HCC)    Gout    L MTP-->none since allopurinol   Hypercholesterolemia    Hypertension    Hypothyroidism    Nephrolithiasis    R renal pelvis, 2025    Past Surgical History:  Procedure Laterality Date   cataract surgery     both   COLONOSCOPY  2018    Outpatient Medications Prior to Visit  Medication Sig Dispense Refill   allopurinol (ZYLOPRIM) 100 MG tablet Take 1 tablet by mouth daily.     amLODipine (NORVASC) 5 MG tablet Take 5 mg by mouth daily.     lisinopril (ZESTRIL) 10 MG tablet Take 10 mg by mouth daily.     metFORMIN (GLUCOPHAGE) 1000 MG tablet Take 1 tablet by mouth 2 (two) times daily with a meal.     simvastatin (ZOCOR) 40 MG tablet Take 0.5 tablets by mouth every evening.     SYNTHROID 50 MCG tablet Take 50 mcg by mouth daily.     VITAMIN D PO Take by mouth as needed.     No facility-administered medications prior to visit.    Not on File  Review of Systems As per HPI  PE:    10/13/2024    8:17 AM 07/07/2024    2:53 PM 06/25/2024    2:46 PM  Vitals with BMI  Height 5' 9.75 5' 9.75 5' 9.75  Weight 179 lbs  182 lbs  BMI 25.86  26.29  Systolic 132 102 894  Diastolic 74 58 67  Pulse 80 91 84     Physical Exam  Gen: Alert, well appearing.  Patient is oriented to person, place, time, and situation. AFFECT: pleasant, lucid thought and speech. CV: RRR, no m/r/g.   LUNGS: CTA bilat, nonlabored resps, good aeration in all lung fields. EXT: no clubbing or cyanosis.  no edema.  Foot exam -  no swelling, tenderness or skin or vascular lesions. Color and temperature is normal. Sensation is intact. Peripheral pulses are palpable. Toenails are normal.  LABS:  Last CBC Lab Results  Component Value Date   WBC 8.6 06/25/2024   HGB 13.6 06/25/2024   HCT 40.2 06/25/2024   MCV 84.1 06/25/2024   MCH 27.8 04/15/2024   RDW 14.0  06/25/2024   PLT 218.0 06/25/2024   Last metabolic panel Lab Results  Component Value Date   GLUCOSE 125 (H) 10/13/2024   NA 142 10/13/2024   K 4.3 10/13/2024   CL 107 10/13/2024   CO2 25 10/13/2024   BUN 15 10/13/2024   CREATININE 0.93 10/13/2024   GFR 81.92 10/13/2024   CALCIUM 9.9 10/13/2024   PROT 7.3 10/13/2024   ALBUMIN 4.7 10/13/2024   BILITOT 0.6 10/13/2024   ALKPHOS 94 10/13/2024   AST 17 10/13/2024   ALT 18 10/13/2024   Last lipids Lab Results  Component Value Date   CHOL 161 10/13/2024   HDL 47.90 10/13/2024   LDLCALC 80 10/13/2024   TRIG 168.0 (H) 10/13/2024   CHOLHDL 3 10/13/2024   Last hemoglobin A1c Lab Results  Component Value Date   HGBA1C 6.1 (A) 10/13/2024   HGBA1C 6.1 10/13/2024   HGBA1C 6.1 10/13/2024   HGBA1C 6.1 10/13/2024   Last thyroid functions Lab Results  Component Value Date   TSH 1.84 10/13/2024   IMPRESSION AND PLAN:  1.  Diabetes without complication: Metformin 1000 mg twice a day. Hemoglobin A1c is 6.1% today. Feet exam normal today. Urine microalbumin/creatinine today and renal function today.   2.  Hypertension, well-controlled on amlodipine 5 mg a day and lisinopril 10 mg a day. Metabolic panel today.   3.  Hypercholesterolemia, doing well on simvastatin 40 mg a day. Lipid panel today.   4.  Acquired hypothyroidism. Continue 50 mcg levothyroxine daily. TSH today.  #5 nephrolithiasis-> 1.8 cm right renal pelvis stone.  Minimally symptomatic. Plan with urology to get extraction soon.  #6 restless leg syndrome. Will do trial of Mirapex 0.125 mg tabs, 1 tab at night to start, increase by 1 tab every 3 days to a maximum of 4 tabs at night.  An After Visit Summary was printed and given to the patient.  FOLLOW UP: Return in about 6 months (around 04/12/2025) for routine chronic illness f/u.  Signed:  Gerlene Hockey, MD           10/14/2024

## 2024-10-14 ENCOUNTER — Telehealth: Payer: Self-pay

## 2024-10-14 ENCOUNTER — Encounter: Payer: Self-pay | Admitting: Family Medicine

## 2024-10-14 MED ORDER — PRAMIPEXOLE DIHYDROCHLORIDE 0.125 MG PO TABS: ORAL_TABLET | ORAL | 0 refills | Status: DC

## 2024-10-14 NOTE — Telephone Encounter (Signed)
-----   Message from Aleene VEAR Hockey sent at 10/14/2024  8:28 AM EST ----- Please call patient and tell him I forgot to address his restless legs syndrome. I just sent in a prescription for medication I want him to try. Will do trial of Mirapex 0.125 mg tabs, 1 tab about 2 hours before bed to start, increase by 1 tab every 3 days to a maximum of 4 tabs at night. Follow-up 3-4 wks.  Call for RF if running out prior to f/u appt.

## 2024-10-14 NOTE — Addendum Note (Signed)
 Addended by: CANDISE ALEENE DEL on: 10/14/2024 08:28 AM   Modules accepted: Orders

## 2024-10-14 NOTE — Telephone Encounter (Signed)
 Spoke with patient regarding results/recommendations.

## 2024-10-15 ENCOUNTER — Ambulatory Visit: Payer: Self-pay | Admitting: Family Medicine

## 2024-10-17 ENCOUNTER — Telehealth: Payer: Self-pay

## 2024-10-17 NOTE — Telephone Encounter (Signed)
 Copied from CRM #[id number]. Topic: General - Other >> Oct 17, 2024  1:04 PM Victoria A wrote: Reason for CRM: Patient called said there is a bill from [facility] that says they need information from Dr.McGowen's office. Patient said he spoke with Billing and was told they could not help him he would need to call the office. Service was on 07/11/24

## 2024-10-21 ENCOUNTER — Other Ambulatory Visit: Payer: Self-pay | Admitting: Family Medicine

## 2024-10-21 NOTE — Telephone Encounter (Signed)
 Spoke with pt and provided update, last OV pertaining to reasoning for labs printed and faxed to [phone].

## 2024-10-21 NOTE — Telephone Encounter (Signed)
 CMA spoke with [facility], they can reprocess tests with updated codes if applicable.

## 2024-10-24 ENCOUNTER — Encounter: Payer: Self-pay | Admitting: Family Medicine

## 2024-10-24 ENCOUNTER — Other Ambulatory Visit: Payer: Self-pay

## 2024-10-24 DIAGNOSIS — I10 Essential (primary) hypertension: Secondary | ICD-10-CM

## 2024-10-24 MED ORDER — LISINOPRIL 10 MG PO TABS: 10.0000 mg | ORAL_TABLET | Freq: Every day | ORAL | 1 refills | Status: AC

## 2024-11-04 ENCOUNTER — Ambulatory Visit: Admitting: Family Medicine

## 2024-11-10 ENCOUNTER — Encounter: Payer: Self-pay | Admitting: Family Medicine

## 2024-11-10 ENCOUNTER — Ambulatory Visit: Payer: Self-pay | Admitting: Family Medicine

## 2024-11-10 NOTE — Progress Notes (Signed)
 Date of COVID positive in last 90 days:  PCP - Aleene Hockey, MD Cardiologist - n/a  Chest x-ray - N/A EKG - 11/11/24 Epic/chart Stress Test - N/A ECHO - N/A Cardiac Cath - N/A Pacemaker/ICD device last checked:N/A Spinal Cord Stimulator:N/A  Bowel Prep - N/A  Sleep Study - N/A CPAP -   Fasting Blood Sugar - no checks at home Checks Blood Sugar  times a day  Last dose of GLP1 agonist-  N/A GLP1 instructions:  Do not take after     Last dose of SGLT-2 inhibitors-  N/A SGLT-2 instructions:  Do not take after     Blood Thinner Instructions: N/A Last dose:   Time: Aspirin Instructions:N/A Last Dose:  Activity level: Can go up a flight of stairs and perform activities of daily living without stopping and without symptoms of chest pain or shortness of breath.  Anesthesia review: left axis deviation on EKG  Patient denies shortness of breath, fever, cough and chest pain at PAT appointment  Patient verbalized understanding of instructions that were given to them at the PAT appointment. Patient was also instructed that they will need to review over the PAT instructions again at home before surgery.

## 2024-11-10 NOTE — Patient Instructions (Signed)
 SURGICAL WAITING ROOM VISITATION  Patients having surgery or a procedure may have no more than 2 support people in the waiting area - these visitors may rotate.    Children under the age of 56 must have an adult with them who is not the patient.  Visitors with respiratory illnesses are discouraged from visiting and should remain at home.  If the patient needs to stay at the hospital during part of their recovery, the visitor guidelines for inpatient rooms apply. Pre-op nurse will coordinate an appropriate time for 1 support person to accompany patient in pre-op.  This support person may not rotate.    Please refer to the [facility] website for the visitor guidelines for Inpatients (after your surgery is over and you are in a regular room).    Your procedure is scheduled on: 11/21/24   Report to [facility] Main Entrance    Report to admitting at 5:15 AM   Call this number if you have problems the morning of surgery [phone]   Do not eat food or drink liquids :After Midnight.          If you have questions, please contact your surgeon's office.   FOLLOW BOWEL PREP AND ANY ADDITIONAL PRE OP INSTRUCTIONS YOU RECEIVED FROM YOUR SURGEON'S OFFICE!!!     Oral Hygiene is also important to reduce your risk of infection.                                    Remember - BRUSH YOUR TEETH THE MORNING OF SURGERY WITH YOUR REGULAR TOOTHPASTE  DENTURES WILL BE REMOVED PRIOR TO SURGERY PLEASE DO NOT APPLY Poly grip OR ADHESIVES!!!   Stop all vitamins and herbal supplements 7 days before surgery.   Take these medicines the morning of surgery with A SIP OF WATER: Omeprazole, Synthroid   DO NOT TAKE ANY ORAL DIABETIC MEDICATIONS DAY OF YOUR SURGERY  How to Manage Your Diabetes Before and After Surgery  Why is it important to control my blood sugar before and after surgery? Improving blood sugar levels before and after surgery helps healing and can limit problems. A way of  improving blood sugar control is eating a healthy diet by:  Eating less sugar and carbohydrates  Increasing activity/exercise  Talking with your doctor about reaching your blood sugar goals High blood sugars (greater than 180 mg/dL) can raise your risk of infections and slow your recovery, so you will need to focus on controlling your diabetes during the weeks before surgery. Make sure that the doctor who takes care of your diabetes knows about your planned surgery including the date and location.  How do I manage my blood sugar before surgery? Check your blood sugar at least 4 times a day, starting 2 days before surgery, to make sure that the level is not too high or low. Check your blood sugar the morning of your surgery when you wake up and every 2 hours until you get to the Short Stay unit. If your blood sugar is less than 70 mg/dL, you will need to treat for low blood sugar: Do not take insulin. Treat a low blood sugar (less than 70 mg/dL) with  cup of clear juice (cranberry or apple), 4 glucose tablets, OR glucose gel. Recheck blood sugar in 15 minutes after treatment (to make sure it is greater than 70 mg/dL). If your blood sugar is not greater than 70  mg/dL on recheck, call [phone] for further instructions. Report your blood sugar to the short stay nurse when you get to Short Stay.  If you are admitted to the hospital after surgery: Your blood sugar will be checked by the staff and you will probably be given insulin after surgery (instead of oral diabetes medicines) to make sure you have good blood sugar levels. The goal for blood sugar control after surgery is 80-180 mg/dL.   WHAT DO I DO ABOUT MY DIABETES MEDICATION?  Do not take oral diabetes medicines (pills) the morning of surgery.  Reviewed and Endorsed by [facility] Patient Education Committee, August 2015             You may not have any metal on your body including hair pins, jewelry, and body piercing              Do not wear lotions, powders, cologne, or deodorant              Men may shave face and neck.   Do not bring valuables to the hospital. [facility] IS NOT             RESPONSIBLE   FOR VALUABLES.   Contacts, glasses, dentures or bridgework may not be worn into surgery.   Bring small overnight bag day of surgery.   DO NOT BRING YOUR HOME MEDICATIONS TO THE HOSPITAL. PHARMACY WILL DISPENSE MEDICATIONS LISTED ON YOUR MEDICATION LIST TO YOU DURING YOUR ADMISSION IN THE HOSPITAL!   Special Instructions: Bring a copy of your healthcare power of attorney and living will documents the day of surgery if you haven't scanned them before.              Please read over the following fact sheets you were given: IF YOU HAVE QUESTIONS ABOUT YOUR PRE-OP INSTRUCTIONS PLEASE CALL [phone].  [facility] - Preparing for Surgery Before surgery, you can play an important role.  Because skin is not sterile, your skin needs to be as free of germs as possible.  You can reduce the number of germs on your skin by washing with CHG (chlorahexidine gluconate) soap before surgery.  CHG is an antiseptic cleaner which kills germs and bonds with the skin to continue killing germs even after washing. Please DO NOT use if you have an allergy to CHG or antibacterial soaps.  If your skin becomes reddened/irritated stop using the CHG and inform your nurse when you arrive at Short Stay. Do not shave (including legs and underarms) for at least 48 hours prior to the first CHG shower.  You may shave your face/neck.  Please follow these instructions carefully:  1.  Shower with CHG Soap the night before surgery ONLY (DO NOT USE THE SOAP THE MORNING OF SURGERY).  2.  If you choose to wash your hair, wash your hair first as usual with your normal  shampoo.  3.  After you shampoo, rinse your hair and body thoroughly to remove the shampoo.                             4.  Use CHG as you would any other liquid soap.  You can  apply chg directly to the skin and wash.  Gently with a scrungie or clean washcloth.  5.  Apply the CHG Soap to your body ONLY FROM THE NECK DOWN.   Do   not use on face/ open  Wound or open sores. Avoid contact with eyes, ears mouth and   genitals (private parts).                       Wash face,  Genitals (private parts) with your normal soap.             6.  Wash thoroughly, paying special attention to the area where your    surgery  will be performed.  7.  Thoroughly rinse your body with warm water from the neck down.  8.  DO NOT shower/wash with your normal soap after using and rinsing off the CHG Soap.                9.  Pat yourself dry with a clean towel.            10.  Wear clean pajamas.            11.  Place clean sheets on your bed the night of your first shower and do not  sleep with pets. Day of Surgery : Do not apply any CHG, lotions/deodorants the morning of surgery.  Please wear clean clothes to the hospital/surgery center.  FAILURE TO FOLLOW THESE INSTRUCTIONS MAY RESULT IN THE CANCELLATION OF YOUR SURGERY  PATIENT SIGNATURE_________________________________  NURSE SIGNATURE__________________________________  ________________________________________________________________________   If you received a COVID test during your pre-op visit  it is requested that you wear a mask when out in public, stay away from anyone that may not be feeling well and notify your surgeon if you develop symptoms. If you test positive for Covid or have been in contact with anyone that has tested positive in the last 10 days please notify you surgeon.     WHAT IS A BLOOD TRANSFUSION? Blood Transfusion Information  A transfusion is the replacement of blood or some of its parts. Blood is made up of multiple cells which provide different functions. Red blood cells carry oxygen and are used for blood loss replacement. White blood cells fight against infection. Platelets  control bleeding. Plasma helps clot blood. Other blood products are available for specialized needs, such as hemophilia or other clotting disorders. BEFORE THE TRANSFUSION  Who gives blood for transfusions?  Healthy volunteers who are fully evaluated to make sure their blood is safe. This is blood bank blood. Transfusion therapy is the safest it has ever been in the practice of medicine. Before blood is taken from a donor, a complete history is taken to make sure that person has no history of diseases nor engages in risky social behavior (examples are intravenous drug use or sexual activity with multiple partners). The donor's travel history is screened to minimize risk of transmitting infections, such as malaria. The donated blood is tested for signs of infectious diseases, such as HIV and hepatitis. The blood is then tested to be sure it is compatible with you in order to minimize the chance of a transfusion reaction. If you or a relative donates blood, this is often done in anticipation of surgery and is not appropriate for emergency situations. It takes many days to process the donated blood. RISKS AND COMPLICATIONS Although transfusion therapy is very safe and saves many lives, the main dangers of transfusion include:  Getting an infectious disease. Developing a transfusion reaction. This is an allergic reaction to something in the blood you were given. Every precaution is taken to prevent this. The decision to have a blood transfusion has been considered carefully by  your caregiver before blood is given. Blood is not given unless the benefits outweigh the risks. AFTER THE TRANSFUSION Right after receiving a blood transfusion, you will usually feel much better and more energetic. This is especially true if your red blood cells have gotten low (anemic). The transfusion raises the level of the red blood cells which carry oxygen, and this usually causes an energy increase. The nurse administering the  transfusion will monitor you carefully for complications. HOME CARE INSTRUCTIONS  No special instructions are needed after a transfusion. You may find your energy is better. Speak with your caregiver about any limitations on activity for underlying diseases you may have. SEEK MEDICAL CARE IF:  Your condition is not improving after your transfusion. You develop redness or irritation at the intravenous (IV) site. SEEK IMMEDIATE MEDICAL CARE IF:  Any of the following symptoms occur over the next 12 hours: Shaking chills. You have a temperature by mouth above 102 F (38.9 C), not controlled by medicine. Chest, back, or muscle pain. People around you feel you are not acting correctly or are confused. Shortness of breath or difficulty breathing. Dizziness and fainting. You get a rash or develop hives. You have a decrease in urine output. Your urine turns a dark color or changes to pink, red, or brown. Any of the following symptoms occur over the next 10 days: You have a temperature by mouth above 102 F (38.9 C), not controlled by medicine. Shortness of breath. Weakness after normal activity. The white part of the eye turns yellow (jaundice). You have a decrease in the amount of urine or are urinating less often. Your urine turns a dark color or changes to pink, red, or brown. Document Released: 11/24/2000 Document Revised: 02/19/2012 Document Reviewed: 07/13/2008 [facility] Patient Information 2014 [place], [place].  _______________________________________________________________________

## 2024-11-11 ENCOUNTER — Encounter (HOSPITAL_COMMUNITY)
Admission: RE | Admit: 2024-11-11 | Discharge: 2024-11-11 | Disposition: A | Source: Ambulatory Visit | Attending: Urology

## 2024-11-11 ENCOUNTER — Encounter (HOSPITAL_COMMUNITY): Payer: Self-pay

## 2024-11-11 ENCOUNTER — Other Ambulatory Visit: Payer: Self-pay

## 2024-11-11 VITALS — BP 132/81 | HR 88 | Temp 97.8°F | Resp 14 | Ht 70.0 in | Wt 178.2 lb

## 2024-11-11 DIAGNOSIS — Z01818 Encounter for other preprocedural examination: Secondary | ICD-10-CM | POA: Diagnosis not present

## 2024-11-11 DIAGNOSIS — E1169 Type 2 diabetes mellitus with other specified complication: Secondary | ICD-10-CM

## 2024-11-11 HISTORY — DX: Gastro-esophageal reflux disease without esophagitis: K21.9

## 2024-11-11 HISTORY — DX: Unspecified asthma, uncomplicated: J45.909

## 2024-11-11 LAB — CBC
HCT: 47 % (ref 39.0–52.0)
Hemoglobin: 15.9 g/dL (ref 13.0–17.0)
MCH: 28.7 pg (ref 26.0–34.0)
MCHC: 33.8 g/dL (ref 30.0–36.0)
MCV: 84.8 fL (ref 80.0–100.0)
Platelets: 225 K/uL (ref 150–400)
RBC: 5.54 MIL/uL (ref 4.22–5.81)
RDW: 13.2 % (ref 11.5–15.5)
WBC: 7.4 K/uL (ref 4.0–10.5)
nRBC: 0 % (ref 0.0–0.2)

## 2024-11-11 LAB — BASIC METABOLIC PANEL WITH GFR
Anion gap: 11 (ref 5–15)
BUN: 16 mg/dL (ref 8–23)
CO2: 25 mmol/L (ref 22–32)
Calcium: 9.8 mg/dL (ref 8.9–10.3)
Chloride: 106 mmol/L (ref 98–111)
Creatinine, Ser: 1.03 mg/dL (ref 0.61–1.24)
GFR, Estimated: >60 mL/min (ref >60)
Glucose, Bld: 150 mg/dL — ABNORMAL HIGH (ref 70–99)
Potassium: 4.1 mmol/L (ref 3.5–5.1)
Sodium: 141 mmol/L (ref 135–145)

## 2024-11-11 LAB — GLUCOSE, CAPILLARY: Glucose-Capillary: 154 mg/dL — ABNORMAL HIGH (ref 70–99)

## 2024-11-12 LAB — URINE CULTURE: Culture: NO GROWTH

## 2024-11-20 NOTE — Anesthesia Preprocedure Evaluation (Signed)
 Anesthesia Evaluation  Patient identified by MRN, date of birth, ID band Patient awake    Reviewed: Allergy & Precautions, H&P , NPO status , Patient's Chart, lab work & pertinent test results  Airway Mallampati: II  TM Distance: >3 FB Neck ROM: Full    Dental no notable dental hx. (+) Teeth Intact, Dental Advisory Given, Missing,    Pulmonary    Pulmonary exam normal breath sounds clear to auscultation       Cardiovascular Exercise Tolerance: Good hypertension, Pt. on medications negative cardio ROS Normal cardiovascular exam Rhythm:Regular Rate:Normal     Neuro/Psych negative neurological ROS  negative psych ROS   GI/Hepatic negative GI ROS, Neg liver ROS,GERD  Medicated and Controlled,,  Endo/Other  diabetes, Well Controlled, Oral Hypoglycemic AgentsHypothyroidism    Renal/GU Renal diseasenegative Renal ROS  negative genitourinary   Musculoskeletal negative musculoskeletal ROS (+)    Abdominal   Peds negative pediatric ROS (+)  Hematology negative hematology ROS (+)   Anesthesia Other Findings   Reproductive/Obstetrics negative OB ROS                              Anesthesia Physical Anesthesia Plan  ASA: 3  Anesthesia Plan: General   Post-op Pain Management: Tylenol PO (pre-op)*, Celebrex PO (pre-op)* and Minimal or no pain anticipated   Induction: Intravenous  PONV Risk Score and Plan: 2 and Ondansetron, Dexamethasone and Treatment may vary due to age or medical condition  Airway Management Planned: Oral ETT and LMA  Additional Equipment: None  Intra-op Plan:   Post-operative Plan: Extubation in OR  Informed Consent: I have reviewed the patients History and Physical, chart, labs and discussed the procedure including the risks, benefits and alternatives for the proposed anesthesia with the patient or authorized representative who has indicated his/her understanding and  acceptance.       Plan Discussed with: Anesthesiologist and CRNA  Anesthesia Plan Comments: (  )         Anesthesia Quick Evaluation

## 2024-11-21 ENCOUNTER — Ambulatory Visit (HOSPITAL_COMMUNITY): Admitting: Anesthesiology

## 2024-11-21 ENCOUNTER — Ambulatory Visit (HOSPITAL_COMMUNITY)

## 2024-11-21 ENCOUNTER — Other Ambulatory Visit: Payer: Self-pay

## 2024-11-21 ENCOUNTER — Encounter (HOSPITAL_COMMUNITY): Payer: Self-pay | Admitting: Urology

## 2024-11-21 ENCOUNTER — Encounter (HOSPITAL_COMMUNITY): Admission: RE | Disposition: A | Payer: Self-pay | Source: Ambulatory Visit | Attending: Urology

## 2024-11-21 ENCOUNTER — Encounter (HOSPITAL_COMMUNITY): Payer: Self-pay | Admitting: Medical

## 2024-11-21 ENCOUNTER — Observation Stay (HOSPITAL_COMMUNITY)
Admission: RE | Admit: 2024-11-21 | Discharge: 2024-11-22 | Disposition: A | Source: Ambulatory Visit | Attending: Urology | Admitting: Urology

## 2024-11-21 DIAGNOSIS — Z79899 Other long term (current) drug therapy: Secondary | ICD-10-CM | POA: Diagnosis not present

## 2024-11-21 DIAGNOSIS — I10 Essential (primary) hypertension: Secondary | ICD-10-CM | POA: Insufficient documentation

## 2024-11-21 DIAGNOSIS — N200 Calculus of kidney: Principal | ICD-10-CM | POA: Insufficient documentation

## 2024-11-21 DIAGNOSIS — E119 Type 2 diabetes mellitus without complications: Secondary | ICD-10-CM | POA: Diagnosis not present

## 2024-11-21 DIAGNOSIS — E1169 Type 2 diabetes mellitus with other specified complication: Secondary | ICD-10-CM

## 2024-11-21 DIAGNOSIS — Z7984 Long term (current) use of oral hypoglycemic drugs: Secondary | ICD-10-CM | POA: Diagnosis not present

## 2024-11-21 DIAGNOSIS — E039 Hypothyroidism, unspecified: Secondary | ICD-10-CM | POA: Insufficient documentation

## 2024-11-21 HISTORY — PX: NEPHROLITHOTOMY: SHX5134

## 2024-11-21 LAB — TYPE AND SCREEN
ABO/RH(D): O POS
Antibody Screen: NEGATIVE

## 2024-11-21 LAB — GLUCOSE, CAPILLARY
Glucose-Capillary: 138 mg/dL — ABNORMAL HIGH (ref 70–99)
Glucose-Capillary: 204 mg/dL — ABNORMAL HIGH (ref 70–99)
Glucose-Capillary: 190 mg/dL — ABNORMAL HIGH (ref 70–99)

## 2024-11-21 LAB — BASIC METABOLIC PANEL WITH GFR
Anion gap: 17 — ABNORMAL HIGH (ref 5–15)
BUN: 21 mg/dL (ref 8–23)
CO2: 20 mmol/L — ABNORMAL LOW (ref 22–32)
Calcium: 9.3 mg/dL (ref 8.9–10.3)
Chloride: 104 mmol/L (ref 98–111)
Creatinine, Ser: 1.15 mg/dL (ref 0.61–1.24)
GFR, Estimated: >60 mL/min (ref >60)
Glucose, Bld: 204 mg/dL — ABNORMAL HIGH (ref 70–99)
Potassium: 3.8 mmol/L (ref 3.5–5.1)
Sodium: 140 mmol/L (ref 135–145)

## 2024-11-21 LAB — HEMOGLOBIN AND HEMATOCRIT, BLOOD
HCT: 42.1 % (ref 39.0–52.0)
Hemoglobin: 14.5 g/dL (ref 13.0–17.0)

## 2024-11-21 SURGERY — NEPHROLITHOTOMY PERCUTANEOUS
Anesthesia: General | Laterality: Right

## 2024-11-21 MED ORDER — ACETAMINOPHEN 500 MG PO TABS
1000.0000 mg | ORAL_TABLET | Freq: Once | ORAL | Status: AC
  Administered 2024-11-21: 1000 mg via ORAL
  Filled 2024-11-21: qty 2

## 2024-11-21 MED ORDER — PROPOFOL 10 MG/ML IV BOLUS
INTRAVENOUS | Status: DC | PRN
  Administered 2024-11-21: 120 mg via INTRAVENOUS

## 2024-11-21 MED ORDER — CHLORHEXIDINE GLUCONATE 0.12 % MT SOLN
15.0000 mL | Freq: Once | OROMUCOSAL | Status: AC
  Administered 2024-11-21: 15 mL via OROMUCOSAL

## 2024-11-21 MED ORDER — TAMSULOSIN HCL 0.4 MG PO CAPS: 0.4000 mg | ORAL_CAPSULE | Freq: Every day | ORAL | 0 refills | Status: AC

## 2024-11-21 MED ORDER — SUGAMMADEX SODIUM 200 MG/2ML IV SOLN
INTRAVENOUS | Status: AC
  Filled 2024-11-21: qty 2

## 2024-11-21 MED ORDER — LACTATED RINGERS IV SOLN: INTRAVENOUS | Status: AC

## 2024-11-21 MED ORDER — CEFAZOLIN SODIUM-DEXTROSE 2-4 GM/100ML-% IV SOLN
2.0000 g | INTRAVENOUS | Status: AC
  Administered 2024-11-21: 2 g via INTRAVENOUS
  Filled 2024-11-21: qty 100

## 2024-11-21 MED ORDER — PHENYLEPHRINE 80 MCG/ML (10ML) SYRINGE FOR IV PUSH (FOR BLOOD PRESSURE SUPPORT)
PREFILLED_SYRINGE | INTRAVENOUS | Status: AC
  Filled 2024-11-21: qty 10

## 2024-11-21 MED ORDER — LIDOCAINE HCL (CARDIAC) PF 100 MG/5ML IV SOSY
PREFILLED_SYRINGE | INTRAVENOUS | Status: DC | PRN
  Administered 2024-11-21: 80 mg via INTRAVENOUS

## 2024-11-21 MED ORDER — MIDAZOLAM HCL 2 MG/2ML IJ SOLN
INTRAMUSCULAR | Status: AC
  Filled 2024-11-21: qty 2

## 2024-11-21 MED ORDER — TRAMADOL HCL 50 MG PO TABS: 50.0000 mg | ORAL_TABLET | Freq: Four times a day (QID) | ORAL | 0 refills | Status: AC | PRN

## 2024-11-21 MED ORDER — FENTANYL CITRATE (PF) 100 MCG/2ML IJ SOLN
INTRAMUSCULAR | Status: DC | PRN
  Administered 2024-11-21: 50 ug via INTRAVENOUS

## 2024-11-21 MED ORDER — ORAL CARE MOUTH RINSE: 15.0000 mL | Freq: Once | OROMUCOSAL | Status: AC

## 2024-11-21 MED ORDER — ROCURONIUM BROMIDE 100 MG/10ML IV SOLN
INTRAVENOUS | Status: DC | PRN
  Administered 2024-11-21 (×2): 10 mg via INTRAVENOUS
  Administered 2024-11-21: 50 mg via INTRAVENOUS
  Administered 2024-11-21: 20 mg via INTRAVENOUS

## 2024-11-21 MED ORDER — CELECOXIB 200 MG PO CAPS
200.0000 mg | ORAL_CAPSULE | Freq: Once | ORAL | Status: AC
  Administered 2024-11-21: 200 mg via ORAL
  Filled 2024-11-21: qty 1

## 2024-11-21 MED ORDER — LACTATED RINGERS IV SOLN: INTRAVENOUS | Status: DC

## 2024-11-21 MED ORDER — PHENYLEPHRINE 80 MCG/ML (10ML) SYRINGE FOR IV PUSH (FOR BLOOD PRESSURE SUPPORT)
PREFILLED_SYRINGE | INTRAVENOUS | Status: DC | PRN
  Administered 2024-11-21: 160 ug via INTRAVENOUS
  Administered 2024-11-21 (×3): 80 ug via INTRAVENOUS

## 2024-11-21 MED ORDER — HYDROMORPHONE HCL 1 MG/ML IJ SOLN: 0.5000 mg | INTRAMUSCULAR | Status: DC | PRN

## 2024-11-21 MED ORDER — INSULIN ASPART 100 UNIT/ML IJ SOLN: 0.0000 [IU] | INTRAMUSCULAR | Status: DC | PRN

## 2024-11-21 MED ORDER — AMLODIPINE BESYLATE 10 MG PO TABS
5.0000 mg | ORAL_TABLET | Freq: Every day | ORAL | Status: DC
  Administered 2024-11-21: 5 mg via ORAL
  Filled 2024-11-21: qty 1

## 2024-11-21 MED ORDER — ONDANSETRON HCL 4 MG/2ML IJ SOLN: 4.0000 mg | INTRAMUSCULAR | Status: DC | PRN

## 2024-11-21 MED ORDER — ACETAMINOPHEN 10 MG/ML IV SOLN
1000.0000 mg | Freq: Four times a day (QID) | INTRAVENOUS | Status: DC
  Administered 2024-11-21 – 2024-11-22 (×3): 1000 mg via INTRAVENOUS
  Filled 2024-11-21 (×3): qty 100

## 2024-11-21 MED ORDER — DEXAMETHASONE SOD PHOSPHATE PF 10 MG/ML IJ SOLN
INTRAMUSCULAR | Status: DC | PRN
  Administered 2024-11-21: 4 mg via INTRAVENOUS

## 2024-11-21 MED ORDER — LEVOTHYROXINE SODIUM 50 MCG PO TABS
50.0000 ug | ORAL_TABLET | Freq: Every day | ORAL | Status: DC
  Administered 2024-11-22: 50 ug via ORAL
  Filled 2024-11-21: qty 1

## 2024-11-21 MED ORDER — MIDAZOLAM HCL 5 MG/5ML IJ SOLN
INTRAMUSCULAR | Status: DC | PRN
  Administered 2024-11-21: 2 mg via INTRAVENOUS

## 2024-11-21 MED ORDER — ONDANSETRON HCL 4 MG/2ML IJ SOLN
INTRAMUSCULAR | Status: AC
  Filled 2024-11-21: qty 2

## 2024-11-21 MED ORDER — SUGAMMADEX SODIUM 200 MG/2ML IV SOLN
INTRAVENOUS | Status: DC | PRN
  Administered 2024-11-21: 200 mg via INTRAVENOUS

## 2024-11-21 MED ORDER — PHENYLEPHRINE HCL (PRESSORS) 10 MG/ML IV SOLN
INTRAVENOUS | Status: AC
  Filled 2024-11-21: qty 1

## 2024-11-21 MED ORDER — LIDOCAINE HCL (PF) 2 % IJ SOLN
INTRAMUSCULAR | Status: AC
  Filled 2024-11-21: qty 5

## 2024-11-21 MED ORDER — BUPIVACAINE-EPINEPHRINE 0.5% -1:200000 IJ SOLN
INTRAMUSCULAR | Status: DC | PRN
  Administered 2024-11-21: 30 mL

## 2024-11-21 MED ORDER — EPHEDRINE 5 MG/ML INJ
INTRAVENOUS | Status: AC
  Filled 2024-11-21: qty 5

## 2024-11-21 MED ORDER — INSULIN ASPART 100 UNIT/ML IJ SOLN
0.0000 [IU] | Freq: Three times a day (TID) | INTRAMUSCULAR | Status: DC
  Administered 2024-11-21: 3 [IU] via SUBCUTANEOUS
  Administered 2024-11-22: 2 [IU] via SUBCUTANEOUS
  Filled 2024-11-21: qty 3
  Filled 2024-11-21: qty 2

## 2024-11-21 MED ORDER — CEFAZOLIN SODIUM-DEXTROSE 1-4 GM/50ML-% IV SOLN
1.0000 g | Freq: Three times a day (TID) | INTRAVENOUS | Status: AC
  Administered 2024-11-21 – 2024-11-22 (×3): 1 g via INTRAVENOUS
  Filled 2024-11-21 (×3): qty 50

## 2024-11-21 MED ORDER — ONDANSETRON HCL 4 MG/2ML IJ SOLN
INTRAMUSCULAR | Status: DC | PRN
  Administered 2024-11-21: 4 mg via INTRAVENOUS

## 2024-11-21 MED ORDER — ALLOPURINOL 100 MG PO TABS
100.0000 mg | ORAL_TABLET | Freq: Every day | ORAL | Status: DC
  Administered 2024-11-21: 100 mg via ORAL
  Filled 2024-11-21: qty 1

## 2024-11-21 MED ORDER — FENTANYL CITRATE (PF) 100 MCG/2ML IJ SOLN
INTRAMUSCULAR | Status: AC
  Filled 2024-11-21: qty 2

## 2024-11-21 MED ORDER — PHENYLEPHRINE HCL-NACL 20-0.9 MG/250ML-% IV SOLN
INTRAVENOUS | Status: DC | PRN
  Administered 2024-11-21: 50 ug/min via INTRAVENOUS

## 2024-11-21 MED ORDER — POLYETHYLENE GLYCOL 3350 17 G PO PACK: 17.0000 g | PACK | Freq: Every day | ORAL | Status: DC | PRN

## 2024-11-21 MED ORDER — TRAMADOL HCL 50 MG PO TABS
50.0000 mg | ORAL_TABLET | Freq: Four times a day (QID) | ORAL | Status: DC | PRN
  Administered 2024-11-21 – 2024-11-22 (×2): 50 mg via ORAL
  Filled 2024-11-21 (×2): qty 1

## 2024-11-21 MED ADMIN — Simvastatin Tab 20 MG: 20 mg | ORAL | NDC 68084051211

## 2024-11-21 MED ADMIN — Sodium Chloride Irrigation Soln 0.9%: 21000 mL | NDC 00338004724

## 2024-11-21 MED ADMIN — Iohexol Inj 300 MG/ML: 85 mL | NDC 00407141363

## 2024-11-21 MED FILL — Bupivacaine Inj 0.5% w/ Epinephrine 1:200000 (PF): INTRAMUSCULAR | Qty: 30 | Status: AC

## 2024-11-21 MED FILL — Simvastatin Tab 20 MG: 20.0000 mg | ORAL | Qty: 1 | Status: AC

## 2024-11-21 MED FILL — Propofol IV Emul 200 MG/20ML (10 MG/ML): INTRAVENOUS | Qty: 20 | Status: AC

## 2024-11-21 SURGICAL SUPPLY — 53 items
BAG COUNTER SPONGE SURGICOUNT (BAG) IMPLANT
BAG URINE DRAIN 2000ML AR STRL (UROLOGICAL SUPPLIES) IMPLANT
BASKET STONE NCOMPASS (UROLOGICAL SUPPLIES) IMPLANT
BASKET ZERO TIP NITINOL 2.4FR (BASKET) IMPLANT
BENZOIN TINCTURE PRP APPL 2/3 (GAUZE/BANDAGES/DRESSINGS) ×1 IMPLANT
BLADE SURG 15 STRL LF DISP TIS (BLADE) ×1 IMPLANT
BOOTIES KNEE HIGH SLOAN (MISCELLANEOUS) IMPLANT
CATH 2WAY 30CC 24FR (CATHETERS) IMPLANT
CATH COUNCIL 22FR (CATHETERS) IMPLANT
CATH URETERAL DUAL LUMEN 10F (MISCELLANEOUS) ×1 IMPLANT
CATH URETL OPEN 5X70 (CATHETERS) ×1 IMPLANT
CATH UROLOGY TORQUE 40 (MISCELLANEOUS) IMPLANT
CATH UROLOGY TORQUE 65 (CATHETERS) IMPLANT
CATH X-FORCE N30 NEPHROSTOMY (TUBING) ×1 IMPLANT
CHLORAPREP W/TINT 26 (MISCELLANEOUS) ×1 IMPLANT
DRAPE C-ARM 42X120 X-RAY (DRAPES) ×1 IMPLANT
DRAPE LINGEMAN PERC (DRAPES) ×1 IMPLANT
DRAPE SURG IRRIG POUCH 19X23 (DRAPES) ×2 IMPLANT
DRSG TEGADERM 4X4.75 (GAUZE/BANDAGES/DRESSINGS) IMPLANT
DRSG TEGADERM 8X12 (GAUZE/BANDAGES/DRESSINGS) ×2 IMPLANT
EXTRACTOR STONE NITINOL NGAGE (UROLOGICAL SUPPLIES) IMPLANT
GAUZE PAD ABD 8X10 STRL (GAUZE/BANDAGES/DRESSINGS) IMPLANT
GAUZE SPONGE 4X4 12PLY STRL (GAUZE/BANDAGES/DRESSINGS) IMPLANT
GLOVE SURG LX STRL 7.5 STRW (GLOVE) ×1 IMPLANT
GOWN STRL REUS W/ TWL XL LVL3 (GOWN DISPOSABLE) ×1 IMPLANT
GUIDEWIRE AMPLAZ .035X145 (WIRE) ×2 IMPLANT
GUIDEWIRE ANG ZIPWIRE 038X150 (WIRE) IMPLANT
GUIDEWIRE STR DUAL SENSOR (WIRE) ×1 IMPLANT
HLDR NDL AMPLATZ W/INSERTS (MISCELLANEOUS) IMPLANT
IV SET EXTENSION CATH 6 NF (IV SETS) ×1 IMPLANT
KIT BASIN OR (CUSTOM PROCEDURE TRAY) ×1 IMPLANT
KIT PROBE TRILOGY 3.9X350 (MISCELLANEOUS) IMPLANT
KIT TURNOVER KIT A (KITS) ×1 IMPLANT
MANIFOLD NEPTUNE II (INSTRUMENTS) ×1 IMPLANT
MAT ABSORB FLUID 56X50 GRAY (MISCELLANEOUS) ×2 IMPLANT
NDL HYPO 25X1 1.5 SAFETY (NEEDLE) ×1 IMPLANT
NDL SPNL 20GX3.5 QUINCKE YW (NEEDLE) IMPLANT
NDL TROCAR 18X15 ECHO (NEEDLE) IMPLANT
NDL TROCAR 18X20 (NEEDLE) IMPLANT
NS IRRIG 1000ML POUR BTL (IV SOLUTION) ×1 IMPLANT
PACK CYSTO (CUSTOM PROCEDURE TRAY) ×1 IMPLANT
SHEATH PEELAWAY SET 9 (SHEATH) IMPLANT
SPONGE T-LAP 4X18 ~~LOC~~+RFID (SPONGE) ×1 IMPLANT
STENT URET 6FRX26 CONTOUR (STENTS) IMPLANT
SYR 10ML LL (SYRINGE) ×1 IMPLANT
SYR 20ML LL LF (SYRINGE) ×2 IMPLANT
SYR 50ML LL SCALE MARK (SYRINGE) ×1 IMPLANT
TOWEL OR 17X26 10 PK STRL BLUE (TOWEL DISPOSABLE) ×1 IMPLANT
TRACTIP FLEXIVA PULS ID 200XHI (Laser) IMPLANT
TRAY FOLEY MTR SLVR 16FR STAT (SET/KITS/TRAYS/PACK) ×1 IMPLANT
TUBING CONNECTING 10 (TUBING) ×1 IMPLANT
TUBING STONE CATCHER TRILOGY (MISCELLANEOUS) IMPLANT
TUBING UROLOGY SET (TUBING) ×1 IMPLANT

## 2024-11-21 NOTE — Anesthesia Procedure Notes (Signed)
 Procedure Name: Intubation Date/Time: 11/21/2024 7:41 AM  Performed by: Nada Corean CROME, CRNAPre-anesthesia Checklist: Emergency Drugs available, Patient identified, Suction available, Patient being monitored and Timeout performed Patient Re-evaluated:Patient Re-evaluated prior to induction Oxygen Delivery Method: Circle system utilized Preoxygenation: Pre-oxygenation with 100% oxygen Induction Type: IV induction Ventilation: Mask ventilation without difficulty Laryngoscope Size: Mac and 3 Grade View: Grade I Tube type: Oral Tube size: 7.5 mm Number of attempts: 1 Airway Equipment and Method: Stylet Placement Confirmation: ETT inserted through vocal cords under direct vision, positive ETCO2 and breath sounds checked- equal and bilateral Secured at: 23 cm Tube secured with: Tape Dental Injury: Teeth and Oropharynx as per pre-operative assessment  Difficulty Due To: Difficulty was unanticipated

## 2024-11-21 NOTE — Op Note (Signed)
 Pre-operative diagnosis: right sided nephrolithiasis, 2.0 centimeters  Post-operative diagnosis: as above    Procedure performed:  cystoscopy, right retrograde pyelogram with interpretation, right percutaneous renal access, right nephrolithotomy, right nephrostogram,  right ureteral stent placement   Surgeon: Dr. Morene MICAEL Salines  Assistant: Maurilio Agar   Anesthesia: General   Complications: None   Specimens: Several small fragments of the stone extracted will be sent to [facility] for stone composition analysis   Findings: #1 - large hard renal pelvis ~2.0 cm #2 - no obvious fragments remaining #3 - 26cm x 33f right ureteral stent placed   EBL: Approximately 100 cc   Specimens: stone from collecting system sent to path for stone composition   Indication: Indication: Warren Newton is a 73 y.o.  patient with  a large burden right nephrolithiasis. The patient presented today for follow-up definitive management. After  reviewing the management options for treatment, elected to proceed with the above surgical procedure(s). We have discussed the potential benefits and risks of the procedure, side effects of the proposed treatment, the likelihood of the patient achieving the goals of the procedure, and any potential problems that might occur during the procedure or recuperation. Informed consent has been obtained.     Description:  Consent was obtained in the preoperative holding area. The patient was marked appropriately and then taken back to the operating room where they were intubated on the gurney. The patient was flipped prone onto the OR table. Large jelly rolls were placed in the anterior axillary line on both sides allowing the patient's chest and abdomen to fall inbetween. The patient was then prepped and draped in the routine sterile fashion in the right flank. A timeout was then held confirming the proper side and procedure as well as antibiotics were administered.  I then  used the flexible cystoscope and passed gently into the patient's urethra under visual guidance.  I then passed a sensor wire into the right ureteral orifice and advanced it up the ureter.  I then exchanged the stent for the open ended catheter.  The Pollack catheter was then advanced up to the UPJ.  The wire was then removed and a retrograde pyelogram was performed with the above findings. I then turned my attention to the patient's right flank and obtaining percutaneous renal access.  Using the C-arm rotated at 25 and the bulls-eye technique with an 18-gauge coaxial needle the mid-pole posterior lateral calyx was targeted. Then rotating the C-arm AP depth of our needle was noted to be within the calyx and the inner part of the coaxial needle was removed. Urine was noted to return. A 0.038 sensor wire was then passed through the sheath of the coaxial needle and into the right renal collecting system. The wire was then passed down the ureter and into the bladder using fluoroscopic guide and the sheath of the needle was removed. An angiographic catheter was then advanced into the bladder and the wire removed. A Super Stiff wire was then passed into the angiographic catheter and the angiographic catheter removed.  I then passed a dual lumen acatheter over the stiff wire and into the bladder.  I passed a second super stiff wire through the dual lumen catheter and into the bladder - removing the dual lumen, establishing 2 superstiff wires through the targeted calyx and into the bladder.   The 30 French NephroMax balloon was then passed over one of the Super Stiff wires and the tip guided down into the targeted calyx.  The balloon was then inflated to approximately 12 atm, and once there was no waist noted under fluoroscopy the access sheath was advanced over the balloon. The balloon was then removed. The wires were then placed back into the sheaths and snapped to the drape.   Using the rigid nephroscope to explore  the targeted calyx and kidney.  The stones were removed with the triology lithoclast device. Then using a flexible cystoscope to navigate the remaining calyces of the kidney multiple smaller stone fragments encountered.  Using the 0 tip basket these fragments were grabbed and removed. Contrast was injected through the cystoscope and the calyces systematically inspected under fluoroscopic guidance to ensure that all stone fragments had been removed.   Then using the flexible ureteroscope the ureter was navigated in antegrade fashion. All stone fragments were pushed from the ureter into the bladder. Once the ureter was clear the scope was advanced into the bladder and a 0.038 sensor wire was left in the bladder and the scope backed out over wire.  The sensor wire was then backloaded over the rigid nephroscope using the stent pusher and a  26 cm x 6 French double-J ureteral stent was passed antegrade over the sensor wire down into the bladder under fluoroscopic guidance. Once the stent was in the bladder the wire was gently pulled back and a nice curl noted in the bladder. The wire completely removed from the stent, and nice curl on the proximal end of the stent was noted in the renal pelvis. The sheath was then backed out slowly to ensure that all calyces had been inspected and there was nothing behind the sheath.   A 23F ainsworth tip catheter was then passed over one of the Super Stiff wires through the sheath and into the renal pelvis. The sheath was then backed out of the kidney and cut over the red rubber catheter. A nephrostogram was then performed confirming the position of our nephrostomy tube and reassuring that there were no longer any filling defects from the patient's symptoms.   After several minutes of direct pressure and observation was noted that there was no significant bleeding from the nephrostomy tube or around the nephrostomy tube tract.  As such, I remove the nephrostomy tube as well as the  safety wire.  25 cc of local anesthesia was then injected into the patient's wound, and the wound was closed with 3-0 nylon in 2 vertical mattress sutures. The incision was then padded using a bundle of 4 x 4's and Hypafix tape. Patient was subsequently rolled over to the supine position and extubated. The patient was returned to the PACU in excellent condition. At the end of the case all lap and needle and sponges were accounted for. There are no perioperative complications.

## 2024-11-21 NOTE — Anesthesia Postprocedure Evaluation (Signed)
 Anesthesia Post Note  Patient: Warren Newton  Procedure(s) Performed: NEPHROLITHOTOMY PERCUTANEOUS (Right)     Patient location during evaluation: PACU Anesthesia Type: General Level of consciousness: awake and alert Pain management: pain level controlled Vital Signs Assessment: post-procedure vital signs reviewed and stable Respiratory status: spontaneous breathing, nonlabored ventilation, respiratory function stable and patient connected to nasal cannula oxygen Cardiovascular status: blood pressure returned to baseline and stable Postop Assessment: no apparent nausea or vomiting Anesthetic complications: no   No notable events documented.  Last Vitals:  Vitals:   11/21/24 1028 11/21/24 1030  BP: (!) 152/76 138/72  Pulse:  96  Resp:  19  Temp:  (!) 36.4 C  SpO2:  100%    Last Pain:  Vitals:   11/21/24 1030  TempSrc:   PainSc: 0-No pain                 Francia Verry

## 2024-11-21 NOTE — Discharge Instructions (Signed)
Discharge instructions following PCNL ° °Call your doctor for: °Fevers greater than 100.5 °Severe nausea or vomiting °Increasing pain not controlled by pain medication °Increasing redness or drainage from incisions °Decreased urine output or a catheter is no longer draining ° °The number for questions is [phone]. ° °Activity: °Gradually increase activity with short frequent walks, 3-4 times a day.  Avoid strenuous activities, like sports, lawn-mowing, or heavy lifting (more than 10-15 pounds).  Wear loose, comfortable clothing that pull or kink the tube or tubes.  Do not drive while taking pain medication, or until your doctor permitts it. ° °Bathing and dressing changes: °You should not shower for 48 hours after surgery.  Do not soak your back in a bathtub. ° °Drainage bag care: °You may be discharged with a drainage bag around the site of your surgery.  The drainage bag should be secured such that it never pulls or loosens to prevent it from leaking.  It is important to wash her hands before and after emptying the drainage bag to help prevent the spread of infection.  The drainage bag should be emptied as needed.  When the wound stops draining or it is manageable with a dry gauze dressing, you can remove the bag. ° °Diet: °It is extremely important to drink plenty of fluids after surgery, especially water.  You may resume your regular diet, unless otherwise instructed. ° °Medications: °May take Tylenol (acetaminophen) or ibuprofen (Advil, Motrin) as directed over-the-counter. °Take any prescriptions as directed. ° °Follow-up appointments: °Follow-up appointment will be scheduled with Dr. Shameria Trimarco in 10-14 days for hospital check and stent removal. ° °

## 2024-11-21 NOTE — Transfer of Care (Signed)
 Immediate Anesthesia Transfer of Care Note  Patient: Warren Newton  Procedure(s) Performed: NEPHROLITHOTOMY PERCUTANEOUS (Right)  Patient Location: PACU  Anesthesia Type:General  Level of Consciousness: awake, alert , oriented, and patient cooperative  Airway & Oxygen Therapy: Patient Spontanous Breathing and Patient connected to face mask oxygen  Post-op Assessment: Report given to RN and Post -op Vital signs reviewed and stable  Post vital signs: Reviewed and stable  Last Vitals:  Vitals Value Taken Time  BP 138/72 11/21/24 10:30  Temp    Pulse 92 11/21/24 10:30  Resp 19 11/21/24 10:30  SpO2 100 % 11/21/24 10:30  Vitals shown include unfiled device data.  Last Pain:  Vitals:   11/21/24 0633  TempSrc:   PainSc: 1       Patients Stated Pain Goal: 4 (11/21/24 9375)  Complications: No notable events documented.

## 2024-11-21 NOTE — Interval H&P Note (Signed)
 History and Physical Interval Note:  11/21/2024 7:22 AM  Warren Newton  has presented today for surgery, with the diagnosis of RIGHT KIDNEY STONE.  The various methods of treatment have been discussed with the patient and family. After consideration of risks, benefits and other options for treatment, the patient has consented to  Procedures with comments: NEPHROLITHOTOMY PERCUTANEOUS (Right) - RIGHT PERCUTANEOUS NEPHROLITHOTOMY as a surgical intervention.  The patient's history has been reviewed, patient examined, no change in status, stable for surgery.  I have reviewed the patient's chart and labs.  Questions were answered to the patient's satisfaction.     Warren Newton

## 2024-11-22 ENCOUNTER — Encounter (HOSPITAL_COMMUNITY): Payer: Self-pay | Admitting: Urology

## 2024-11-22 DIAGNOSIS — N200 Calculus of kidney: Secondary | ICD-10-CM | POA: Diagnosis not present

## 2024-11-22 LAB — CBC
HCT: 35.8 % — ABNORMAL LOW (ref 39.0–52.0)
Hemoglobin: 12.3 g/dL — ABNORMAL LOW (ref 13.0–17.0)
MCH: 28.6 pg (ref 26.0–34.0)
MCHC: 34.4 g/dL (ref 30.0–36.0)
MCV: 83.3 fL (ref 80.0–100.0)
Platelets: 193 K/uL (ref 150–400)
RBC: 4.3 MIL/uL (ref 4.22–5.81)
RDW: 13.1 % (ref 11.5–15.5)
WBC: 16 K/uL — ABNORMAL HIGH (ref 4.0–10.5)
nRBC: 0 % (ref 0.0–0.2)

## 2024-11-22 LAB — BASIC METABOLIC PANEL WITH GFR
Anion gap: 11 (ref 5–15)
BUN: 20 mg/dL (ref 8–23)
CO2: 23 mmol/L (ref 22–32)
Calcium: 9 mg/dL (ref 8.9–10.3)
Chloride: 106 mmol/L (ref 98–111)
Creatinine, Ser: 1.12 mg/dL (ref 0.61–1.24)
GFR, Estimated: >60 mL/min (ref >60)
Glucose, Bld: 168 mg/dL — ABNORMAL HIGH (ref 70–99)
Potassium: 4.3 mmol/L (ref 3.5–5.1)
Sodium: 139 mmol/L (ref 135–145)

## 2024-11-22 LAB — GLUCOSE, CAPILLARY: Glucose-Capillary: 135 mg/dL — ABNORMAL HIGH (ref 70–99)

## 2024-11-22 NOTE — Discharge Summary (Signed)
 Physician Discharge Summary  Patient ID: Warren Newton MRN: [id number] DOB/AGE: 02-01-1951 73 y.o.  Admit date: 11/21/2024 Discharge date: 11/22/2024  Admission Diagnoses: Right renal stone  Discharge Diagnoses: Same  Discharged Condition: good  Hospital Course:  Patient underwent uncomplicated right sided percutaneous nephrolithotomy with Dr. Cam on 11/21/2024.  Foley was removed postop day number 1 in the morning and he was able to void light pink urine.  Pain was well-controlled, no flank drainage, labs appropriate and he was discharged.   Discharge Exam: Blood pressure 126/69, pulse 75, temperature 97.6 F (36.4 C), temperature source Oral, resp. rate 20, height 5' 10 (1.778 m), weight 80.8 kg, SpO2 96%.  Alert, oriented, no acute distress Abdomen soft, nontender, nondistended Right flank dressing clean dry and intact, no staining  Disposition: Discharge disposition: 01-Home or Self Care       Discharge Instructions     Discharge instructions   Complete by: As directed    You underwent a percutaneous nephrolithotomy(PCNL).  Drink plenty of fluids to keep your kidneys and bladder flushed.  It will be normal to see pink to light red urine with the stent in place.   You have a ureteral stent in place.  This is a tube that extends from your kidney to your bladder.  This may cause blood in the urine, burning with urination, urinary frequency and urgency, and even some flank pain on that side.  Please call our office or present to the ED if you develop fevers >101.3 or pain which is not able to be controlled with oral pain medications. Anti-inflammatories like aleve, ibuprofen, or motrin work best for stent pain compared to narcotics.  Pyridium(Azo) can be used over the counter to help with burning/stinging with urination, and this will turn the urine orange in color. It is normal to have pink and bloody urine with the stent in place. Drink plenty of fluids to keep the  bladder flushed.  No heavy lifting more than 15 pounds for 1 week, but some people will notice the more physically active they are the more the stent is bothersome.  You can shower starting Sunday, and remove the bandage Monday.  If this falls off before then that is okay.  Please call the office if you have any questions or concerns.   Discharge patient   Complete by: As directed    Discharge disposition: 01-Home or Self Care   Discharge patient date: 11/22/2024      Allergies as of 11/22/2024   No Known Allergies      Medication List     TAKE these medications    allopurinol  100 MG tablet Commonly known as: ZYLOPRIM  Take 100 mg by mouth at bedtime.   amLODipine  5 MG tablet Commonly known as: NORVASC  Take 5 mg by mouth at bedtime.   lisinopril  10 MG tablet Commonly known as: ZESTRIL  Take 1 tablet (10 mg total) by mouth daily.   metFORMIN 1000 MG tablet Commonly known as: GLUCOPHAGE Take 1,000 mg by mouth in the morning and at bedtime.   omeprazole 20 MG tablet Commonly known as: PRILOSEC OTC Take 20 mg by mouth every other day.   simvastatin  40 MG tablet Commonly known as: ZOCOR  Take 0.5 tablets by mouth every evening.   Synthroid  50 MCG tablet Generic drug: levothyroxine  Take 50 mcg by mouth daily before breakfast.   tamsulosin  0.4 MG Caps capsule Commonly known as: FLOMAX  Take 1 capsule (0.4 mg total) by mouth daily.   traMADol  50 MG  tablet Commonly known as: Ultram  Take 1 tablet (50 mg total) by mouth every 6 (six) hours as needed for up to 10 doses.        Follow-up Information     Cam Morene ORN, MD Follow up in 2 week(s).   Specialty: Urology Why: Stent removal Contact information: [address] [place] [address] [phone]                  Signed: Redell JAYSON Burnet 11/22/2024, 8:02 AM

## 2024-11-22 NOTE — Plan of Care (Signed)

## 2024-11-22 NOTE — TOC Initial Note (Signed)
 Transition of Care [facility]) - Initial/Assessment Note    Patient Details  Name: Warren Newton MRN: [id number] Date of Birth: 1951-07-07  [facility]) CM/SW Contact:    Sonda Manuella Quill, RN Phone Number: 11/22/2024, 12:50 PM  Clinical Narrative:                 Return call from pt's wife Adrien Sharps [phone]); she said pt lives at home; he returned w/ her support at d/c; insurance/PCP verified; she denied pt experiencing SDOH risks; pt does not have DME, HH services, or home oxygen; obtained address for MOON to be mailed: [address] [place], [place] [address]; no IP CM needs.  Expected Discharge Plan: Home/Self Care Barriers to Discharge: No Barriers Identified   Patient Goals and CMS Choice Patient states their goals for this hospitalization and ongoing recovery are:: ome     [place] ownership interest in [facility].provided to:: Spouse    Expected Discharge Plan and Services   Discharge Planning Services: CM Consult   Living arrangements for the past 2 months: Single Family Home Expected Discharge Date: 11/22/24               DME Arranged: N/A DME Agency: NA       HH Arranged: NA HH Agency: NA        Prior Living Arrangements/Services Living arrangements for the past 2 months: Single Family Home Lives with:: Spouse Patient language and need for interpreter reviewed:: Yes Do you feel safe going back to the place where you live?: Yes      Need for Family Participation in Patient Care: Yes (Comment) Care giver support system in place?: Yes (comment) Current home services:  (n/a) Criminal Activity/Legal Involvement Pertinent to Current Situation/Hospitalization: No - Comment as needed  Activities of Daily Living   ADL Screening (condition at time of admission) Independently performs ADLs?: Yes (appropriate for developmental age) Is the patient deaf or have difficulty hearing?: No Does the patient have difficulty seeing, even when  wearing glasses/contacts?: No Does the patient have difficulty concentrating, remembering, or making decisions?: No  Permission Sought/Granted Permission sought to share information with : Case Manager Permission granted to share information with : Yes, Verbal Permission Granted  Share Information with NAME: Case Manager     Permission granted to share info w Relationship: Kees Idrovo (spouse) [phone]     Emotional Assessment Appearance:: Other (Comment Required (unable to assess) Attitude/Demeanor/Rapport: Unable to Assess Affect (typically observed): Unable to Assess Orientation: :  (unable to assess) Alcohol / Substance Use: Not Applicable Psych Involvement: No (comment)  Admission diagnosis:  Kidney stone [N20.0] Nephrolithiasis [N20.0] Patient Active Problem List   Diagnosis Date Noted   Nephrolithiasis 11/21/2024   Mixed hyperlipidemia 08/27/2023   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 [place] [address] Phone: [phone] Fax: [phone]  [id number] [place],  - [address] [place] [address] Phone: [phone] Fax: [phone]     Social Drivers of Health (SDOH) Social History: SDOH Screenings   Food Insecurity: No Food Insecurity (11/22/2024)  Housing: Low Risk (11/22/2024)  Transportation Needs: No Transportation Needs (11/22/2024)  Utilities: Not At Risk (11/22/2024)  Alcohol Screen: Low  Risk (10/09/2024)  Depression (PHQ2-9): Low Risk (10/13/2024)  Financial Resource Strain: Low Risk (10/09/2024)  Physical Activity: Insufficiently Active (10/09/2024)  Social Connections: Moderately Isolated (11/22/2024)  Stress: No Stress Concern Present (10/09/2024)   Tobacco Use: Low Risk (11/21/2024)   SDOH Interventions: Food Insecurity Interventions: Intervention Not Indicated, Inpatient TOC Housing Interventions: Intervention Not Indicated, Inpatient TOC Transportation Interventions: Intervention Not Indicated, Inpatient TOC Utilities Interventions: Intervention Not Indicated, Inpatient TOC   Readmission Risk Interventions     No data to display

## 2024-11-22 NOTE — Plan of Care (Signed)
°  Problem: Education: Goal: Knowledge of General Education information will improve Description: Including pain rating scale, medication(s)/side effects and non-pharmacologic comfort measures 11/22/2024 1006 by Alaina Dozier PARAS, RN Outcome: Adequate for Discharge 11/22/2024 0728 by Alaina Dozier PARAS, RN Outcome: Progressing   Problem: Health Behavior/Discharge Planning: Goal: Ability to manage health-related needs will improve 11/22/2024 1006 by Alaina Dozier PARAS, RN Outcome: Adequate for Discharge 11/22/2024 0728 by Alaina Dozier PARAS, RN Outcome: Progressing   Problem: Clinical Measurements: Goal: Ability to maintain clinical measurements within normal limits will improve 11/22/2024 1006 by Alaina Dozier PARAS, RN Outcome: Adequate for Discharge 11/22/2024 9271 by Alaina Dozier PARAS, RN Outcome: Progressing Goal: Will remain free from infection 11/22/2024 1006 by Alaina Dozier PARAS, RN Outcome: Adequate for Discharge 11/22/2024 9271 by Alaina Dozier PARAS, RN Outcome: Progressing Goal: Diagnostic test results will improve Outcome: Adequate for Discharge Goal: Respiratory complications will improve Outcome: Adequate for Discharge Goal: Cardiovascular complication will be avoided Outcome: Adequate for Discharge   Problem: Activity: Goal: Risk for activity intolerance will decrease Outcome: Adequate for Discharge   Problem: Nutrition: Goal: Adequate nutrition will be maintained Outcome: Adequate for Discharge   Problem: Coping: Goal: Level of anxiety will decrease Outcome: Adequate for Discharge   Problem: Elimination: Goal: Will not experience complications related to bowel motility Outcome: Adequate for Discharge Goal: Will not experience complications related to urinary retention Outcome: Adequate for Discharge   Problem: Pain Managment: Goal: General experience of comfort will improve and/or be controlled Outcome: Adequate for  Discharge   Problem: Safety: Goal: Ability to remain free from injury will improve Outcome: Adequate for Discharge   Problem: Skin Integrity: Goal: Risk for impaired skin integrity will decrease Outcome: Adequate for Discharge   Problem: Education: Goal: Ability to describe self-care measures that may prevent or decrease complications (Diabetes Survival Skills Education) will improve Outcome: Adequate for Discharge Goal: Individualized Educational Video(s) Outcome: Adequate for Discharge   Problem: Coping: Goal: Ability to adjust to condition or change in health will improve Outcome: Adequate for Discharge   Problem: Fluid Volume: Goal: Ability to maintain a balanced intake and output will improve Outcome: Adequate for Discharge   Problem: Health Behavior/Discharge Planning: Goal: Ability to identify and utilize available resources and services will improve Outcome: Adequate for Discharge Goal: Ability to manage health-related needs will improve Outcome: Adequate for Discharge   Problem: Metabolic: Goal: Ability to maintain appropriate glucose levels will improve Outcome: Adequate for Discharge   Problem: Nutritional: Goal: Maintenance of adequate nutrition will improve Outcome: Adequate for Discharge Goal: Progress toward achieving an optimal weight will improve Outcome: Adequate for Discharge   Problem: Skin Integrity: Goal: Risk for impaired skin integrity will decrease Outcome: Adequate for Discharge   Problem: Tissue Perfusion: Goal: Adequacy of tissue perfusion will improve Outcome: Adequate for Discharge

## 2024-11-22 NOTE — Care Management Obs Status (Signed)
 MEDICARE OBSERVATION STATUS NOTIFICATION   Patient Details  Name: Warren Newton MRN: [id number] Date of Birth: 31-Dec-1950   Medicare Observation Status Notification Given:  Yes    Sonda Manuella Quill, RN 11/22/2024, 12:47 PM

## 2024-11-22 NOTE — TOC Progression Note (Signed)
 Transition of Care [facility]) - Progression Note    Patient Details  Name: Warren Newton MRN: [id number] Date of Birth: 1951/10/16  Transition of Care [facility]) CM/SW Contact  Sonda Manuella Quill, RN Phone Number: 11/22/2024, 11:06 AM  Clinical Narrative:    LVMs for pt [phone]) and spouse Achilles Neville [phone]); awaiting return call for delivery of MOON and completion of assessment.                     Expected Discharge Plan and Services         Expected Discharge Date: 11/22/24                                     Social Drivers of Health (SDOH) Interventions SDOH Screenings   Food Insecurity: No Food Insecurity (11/22/2024)  Housing: Low Risk (11/22/2024)  Transportation Needs: No Transportation Needs (11/22/2024)  Utilities: Not At Risk (11/22/2024)  Alcohol Screen: Low Risk (10/09/2024)  Depression (PHQ2-9): Low Risk (10/13/2024)  Financial Resource Strain: Low Risk (10/09/2024)  Physical Activity: Insufficiently Active (10/09/2024)  Social Connections: Moderately Isolated (11/22/2024)  Stress: No Stress Concern Present (10/09/2024)  Tobacco Use: Low Risk (11/21/2024)    Readmission Risk Interventions     No data to display

## 2024-11-29 LAB — STONE ANALYSIS
Calcium Oxalate Dihydrate: 10 %
Calcium Oxalate Monohydrate: 90 %
Weight Calculi: 680 mg

## 2024-12-01 ENCOUNTER — Encounter: Payer: Self-pay | Admitting: Family Medicine

## 2024-12-10 ENCOUNTER — Other Ambulatory Visit: Payer: Self-pay

## 2024-12-10 ENCOUNTER — Encounter: Payer: Self-pay | Admitting: Family Medicine

## 2024-12-10 DIAGNOSIS — E039 Hypothyroidism, unspecified: Secondary | ICD-10-CM

## 2024-12-10 MED ORDER — LEVOTHYROXINE SODIUM 50 MCG PO TABS: 50.0000 ug | ORAL_TABLET | Freq: Every day | ORAL | 1 refills | Status: AC

## 2024-12-10 NOTE — Telephone Encounter (Signed)
 Spoke with pt to confirm if he receives generic or brand name, he confirmed generic. Last thyroid  follow up/labs completed 10/13/24, provider confirmed no changes with medication. New prescription sent to [facility], per pt request.

## 2025-02-04 ENCOUNTER — Ambulatory Visit

## 2025-04-13 ENCOUNTER — Ambulatory Visit: Admitting: Family Medicine
